# Patient Record
Sex: Female | Born: 1960 | Hispanic: No | Marital: Single | Smoking: Current every day smoker
Health system: Southern US, Community
[De-identification: ages and names within clinical notes are randomized; demographics above are authoritative.]

## PROBLEM LIST (undated history)

## (undated) DIAGNOSIS — J302 Other seasonal allergic rhinitis: Secondary | ICD-10-CM

## (undated) DIAGNOSIS — N200 Calculus of kidney: Secondary | ICD-10-CM

## (undated) DIAGNOSIS — I2510 Atherosclerotic heart disease of native coronary artery without angina pectoris: Secondary | ICD-10-CM

## (undated) DIAGNOSIS — F39 Unspecified mood [affective] disorder: Secondary | ICD-10-CM

## (undated) DIAGNOSIS — F319 Bipolar disorder, unspecified: Secondary | ICD-10-CM

## (undated) HISTORY — DX: Bipolar disorder, unspecified: F31.9

## (undated) HISTORY — DX: Atherosclerotic heart disease of native coronary artery without angina pectoris: I25.10

## (undated) HISTORY — DX: Calculus of kidney: N20.0

---

## 1999-01-24 ENCOUNTER — Other Ambulatory Visit: Admission: RE | Admit: 1999-01-24 | Discharge: 1999-01-24 | Payer: Self-pay | Admitting: Family Medicine

## 2002-01-31 ENCOUNTER — Other Ambulatory Visit: Admission: RE | Admit: 2002-01-31 | Discharge: 2002-01-31 | Payer: Self-pay | Admitting: Obstetrics and Gynecology

## 2012-04-21 ENCOUNTER — Other Ambulatory Visit: Payer: Self-pay | Admitting: Internal Medicine

## 2012-04-21 DIAGNOSIS — Z1231 Encounter for screening mammogram for malignant neoplasm of breast: Secondary | ICD-10-CM

## 2012-05-25 ENCOUNTER — Ambulatory Visit: Payer: Self-pay

## 2012-06-07 ENCOUNTER — Ambulatory Visit: Payer: Self-pay

## 2014-10-18 ENCOUNTER — Emergency Department (INDEPENDENT_AMBULATORY_CARE_PROVIDER_SITE_OTHER)
Admission: EM | Admit: 2014-10-18 | Discharge: 2014-10-18 | Disposition: A | Discharge status: Home / Self Care | Payer: Managed Care, Other (non HMO) | Source: Home / Self Care | Attending: Family Medicine | Admitting: Family Medicine

## 2014-10-18 ENCOUNTER — Telehealth (HOSPITAL_COMMUNITY): Payer: Self-pay | Admitting: Family Medicine

## 2014-10-18 ENCOUNTER — Encounter (HOSPITAL_COMMUNITY): Payer: Self-pay | Admitting: Emergency Medicine

## 2014-10-18 DIAGNOSIS — F99 Mental disorder, not otherwise specified: Secondary | ICD-10-CM

## 2014-10-18 MED ORDER — DIVALPROEX SODIUM 500 MG PO DR TAB: 1000.0000 mg | DELAYED_RELEASE_TABLET | Freq: Every day | ORAL | Status: DC

## 2014-10-18 MED ORDER — OLANZAPINE 10 MG PO TABS: 10.0000 mg | ORAL_TABLET | Freq: Every day | ORAL | Status: DC

## 2014-10-18 MED ORDER — FLUPHENAZINE HCL 5 MG PO TABS: 5.0000 mg | ORAL_TABLET | Freq: Every day | ORAL | Status: DC

## 2014-10-18 NOTE — ED Provider Notes (Signed)
Kristina Knox is a 53 y.o. female who presents to Urgent Care today for refill. Patient requires refill of both her olanzapine and Prolixin. She has moved to [place] is having difficulty commuting to [place] in [place] for her mental health needs. She has run out of Zyprexa and is about to run out of Prolixin. She tolerates these medications well without any significant bothersome side effects. She feels well currently with no SI or HI.   History reviewed. No pertinent past medical history. History reviewed. No pertinent past surgical history. History  Substance Use Topics  . Smoking status: Former Games developermoker  . Smokeless tobacco: Not on file  . Alcohol Use: Not on file   ROS as above Medications: No current facility-administered medications for this encounter.   Current Outpatient Prescriptions  Medication Sig Dispense Refill  . divalproex (DEPAKOTE) 500 MG DR tablet Take 1,000 mg by mouth at bedtime.    . fluPHENAZine (PROLIXIN) 5 MG tablet Take 1 tablet (5 mg total) by mouth at bedtime. 30 tablet 1  . OLANZapine (ZYPREXA) 10 MG tablet Take 1 tablet (10 mg total) by mouth at bedtime. 30 tablet 1   Not on File   Exam:  BP 156/87 mmHg  Pulse 109  Temp(Src) 98.3 F (36.8 C) (Oral)  Resp 16  SpO2 97% Gen: Well NAD Psych: Alert and oriented normal thought process, speech patterns. No SI or HI. No delusions or hallucinations expressed.   No results found for this or any previous visit (from the past 24 hour(s)). No results found.  Assessment and Plan: 53 y.o. female with refill Prolixin and Zyprexa. Refer to [facility].  Discussed warning signs or symptoms. Please see discharge instructions. Patient expresses understanding.     Rodolph BongEvan S Klaira Pesci, MD 10/18/14 [phone]

## 2014-10-18 NOTE — Discharge Instructions (Signed)
Thank you for coming in today. Take medications as directed Follow-up with a mental health doctor in [place] [facility] Go to the emergency room if he feels like hurting herself or others

## 2014-10-18 NOTE — ED Notes (Signed)
Pt states that she needs a medication refill on her medications.

## 2014-11-30 NOTE — ED Notes (Signed)
Note opened in error  Kristina Halvorsen S Orlie Cundari, MD 11/30/14 0859 

## 2015-01-04 ENCOUNTER — Emergency Department (INDEPENDENT_AMBULATORY_CARE_PROVIDER_SITE_OTHER)
Admission: EM | Admit: 2015-01-04 | Discharge: 2015-01-04 | Disposition: A | Discharge status: Home / Self Care | Payer: Medicare HMO | Source: Home / Self Care | Attending: Emergency Medicine | Admitting: Emergency Medicine

## 2015-01-04 ENCOUNTER — Encounter (HOSPITAL_COMMUNITY): Payer: Self-pay | Admitting: *Deleted

## 2015-01-04 ENCOUNTER — Telehealth: Payer: Self-pay

## 2015-01-04 DIAGNOSIS — F39 Unspecified mood [affective] disorder: Secondary | ICD-10-CM

## 2015-01-04 DIAGNOSIS — Z760 Encounter for issue of repeat prescription: Secondary | ICD-10-CM

## 2015-01-04 MED ORDER — OLANZAPINE 10 MG PO TABS
10.0000 mg | ORAL_TABLET | Freq: Every day | ORAL | Status: DC
Start: 2015-01-04 — End: 2015-05-08

## 2015-01-04 MED ORDER — DIVALPROEX SODIUM 500 MG PO DR TAB: 1000.0000 mg | DELAYED_RELEASE_TABLET | Freq: Every day | ORAL | Status: DC

## 2015-01-04 MED ORDER — FLUPHENAZINE HCL 5 MG PO TABS
5.0000 mg | ORAL_TABLET | Freq: Every day | ORAL | Status: DC
Start: 2015-01-04 — End: 2015-05-08

## 2015-01-04 NOTE — Discharge Instructions (Signed)
I have refilled your medications. They should be ready at your pharmacy Monday morning. Make sure you establish with your new doctor for additional refills.

## 2015-01-04 NOTE — Telephone Encounter (Signed)
A user error has taken place: encounter opened in error, closed for administrative reasons.

## 2015-01-04 NOTE — ED Provider Notes (Signed)
CSN: [id number]     Arrival date & time 01/04/15  1748 History   First MD Initiated Contact with Patient 01/04/15 1819     Chief Complaint  Patient presents with  . Medication Refill   (Consider location/radiation/quality/duration/timing/severity/associated sxs/prior Treatment) HPI  She is a 54 year old woman here for medication refill. She states she has been on Depakote, olanzapine, fluphenazine for over 10 years for mood stabilization. Her insurance has changed recently, and she is in the process of establishing care in the [facility] system.  She denies any trouble with these medications. She has not yet run out, but is running low.  History reviewed. No pertinent past medical history. History reviewed. No pertinent past surgical history. History reviewed. No pertinent family history. History  Substance Use Topics  . Smoking status: Former Games developermoker  . Smokeless tobacco: Not on file  . Alcohol Use: Not on file   OB History    No data available     Review of Systems See history of present illness Allergies  Review of patient's allergies indicates no known allergies.  Home Medications   Prior to Admission medications   Medication Sig Start Date End Date Taking? Authorizing Provider  divalproex (DEPAKOTE) 500 MG DR tablet Take 2 tablets (1,000 mg total) by mouth at bedtime. 01/04/15   Charm RingsErin J Sherian Valenza, MD  fluPHENAZine (PROLIXIN) 5 MG tablet Take 1 tablet (5 mg total) by mouth at bedtime. 01/04/15   Charm RingsErin J Adreyan Carbajal, MD  OLANZapine (ZYPREXA) 10 MG tablet Take 1 tablet (10 mg total) by mouth at bedtime. 01/04/15   Charm RingsErin J Sanita Estrada, MD   BP 140/80 mmHg  Pulse 72  Temp(Src) 98.3 F (36.8 C) (Oral)  SpO2 99%  LMP 12/04/2014 Physical Exam  Constitutional: She is oriented to person, place, and time. She appears well-developed and well-nourished. No distress.  Cardiovascular: Normal rate.   Pulmonary/Chest: Effort normal.  Neurological: She is alert and oriented to person, place, and time.    ED  Course  Procedures (including critical care time) Labs Review Labs Reviewed - No data to display  Imaging Review No results found.   MDM   1. Medication refill    3 months of Depakote sent to pharmacy. One month of fluphenazine and one month of olanzapine sent to pharmacy. Follow-up as needed.    Charm RingsErin J Drezden Seitzinger, MD 01/04/15 [phone]

## 2015-01-04 NOTE — ED Notes (Signed)
Pt  Requesting  A  Refill     Of  Her depakote        She      denys  Any  Symptoms

## 2015-05-08 ENCOUNTER — Emergency Department (INDEPENDENT_AMBULATORY_CARE_PROVIDER_SITE_OTHER)
Admission: EM | Admit: 2015-05-08 | Discharge: 2015-05-08 | Disposition: A | Discharge status: Home / Self Care | Payer: Medicare HMO | Source: Home / Self Care | Attending: Family Medicine | Admitting: Family Medicine

## 2015-05-08 ENCOUNTER — Encounter (HOSPITAL_COMMUNITY): Payer: Self-pay | Admitting: *Deleted

## 2015-05-08 DIAGNOSIS — F39 Unspecified mood [affective] disorder: Secondary | ICD-10-CM

## 2015-05-08 DIAGNOSIS — Z760 Encounter for issue of repeat prescription: Secondary | ICD-10-CM

## 2015-05-08 HISTORY — DX: Unspecified mood (affective) disorder: F39

## 2015-05-08 HISTORY — DX: Other seasonal allergic rhinitis: J30.2

## 2015-05-08 MED ORDER — OLANZAPINE 10 MG PO TABS: 10.0000 mg | ORAL_TABLET | Freq: Every day | ORAL | Status: DC

## 2015-05-08 MED ORDER — FLUPHENAZINE HCL 5 MG PO TABS: 5.0000 mg | ORAL_TABLET | Freq: Every day | ORAL | Status: DC

## 2015-05-08 MED ORDER — DIVALPROEX SODIUM 500 MG PO DR TAB
1000.0000 mg | DELAYED_RELEASE_TABLET | Freq: Every day | ORAL | Status: DC
Start: 2015-05-08 — End: 2018-12-13

## 2015-05-08 NOTE — ED Notes (Signed)
Pt is here requesting 3 medication refills.

## 2015-05-08 NOTE — Discharge Instructions (Signed)
We have refilled her medicines for 2 weeks. Please call doctor Avbuere to refill these medicines beyond this point.

## 2015-05-08 NOTE — ED Provider Notes (Signed)
CSN: [id number]     Arrival date & time 05/08/15  1643 History   First MD Initiated Contact with Patient 05/08/15 1728     Chief Complaint  Patient presents with  . Medication Refill   (Consider location/radiation/quality/duration/timing/severity/associated sxs/prior Treatment) HPI Patient presenting today for medication refill. States that she is taking Depakote, olanzapine, and fluphenazine. Patient states that the last medication refill she had was in February by Dr. all negative. Patient states that she still has 2-3 days worth of medications left over. Denies any change in her mental status such as flight of ideas, cognitive delay,. Patient states that her primary care physician is Dr Concepcion Elk of [facility].  Of note patient has a difficult time explaining exactly when she was given her medications but states she's been taking her medications as prescribed. In checking her note patient was only given a 30 day supply of fluphenazine and olanzapine back in February. When asked about how pick the patient could've possibly taken her medications during the months of March April and May patient states that she got her refills from her pharmacy was unable to delineate whether physician may have prescribed her medications. Patient denies visual or auditory hallucinations.      Past Medical History  Diagnosis Date  . Mood disorder   . Seasonal allergies    History reviewed. No pertinent past surgical history. History reviewed. No pertinent family history. History  Substance Use Topics  . Smoking status: Former Games developer  . Smokeless tobacco: Not on file  . Alcohol Use: No   OB History    No data available     Review of Systems Per HPI with all other pertinent systems negative.   Allergies  Codeine  Home Medications   Prior to Admission medications   Medication Sig Start Date End Date Taking? Authorizing Provider  divalproex (DEPAKOTE) 500 MG DR tablet Take 2 tablets (1,000 mg  total) by mouth at bedtime. 05/08/15   Ozella Rocks, MD  fluPHENAZine (PROLIXIN) 5 MG tablet Take 1 tablet (5 mg total) by mouth at bedtime. 05/08/15   Ozella Rocks, MD  OLANZapine (ZYPREXA) 10 MG tablet Take 1 tablet (10 mg total) by mouth at bedtime. 05/08/15   Ozella Rocks, MD   BP 142/89 mmHg  Pulse 96  Temp(Src) 99.1 F (37.3 C) (Oral)  Resp 16  SpO2 100% Physical Exam Physical Exam  Constitutional: oriented to person, place, and time. appears well-developed and well-nourished. No distress.  HENT:  Head: Normocephalic and atraumatic.  Eyes: EOMI. PERRL.  Neck: Normal range of motion.  Pulmonary/Chest: Effort normal. No respiratory distress.  Abdominal: Soft. Bowel sounds are normal. NonTTP, no distension.  Musculoskeletal: Normal range of motion. Non ttp, no effusion.  Neurological: alert and oriented to person, place, and time.  Skin: Skin is warm. No rash noted. non diaphoretic.   ED Course  Procedures (including critical care time) Labs Review Labs Reviewed - No data to display  Imaging Review No results found.   MDM   1. Medication refill   2. Mood disorder    Patient is a very poor historian with regards to her medications and doctor's appointments. I personally attempted to reach Dr.Avbuere through the answering service but never received a return phone call. Patient's medications will be refilled for 2 weeks only and patient given very clear instructions to follow-up with her primary care physician for any further refills. Patient to the ED if she develops any destabilization of her mood.  Ozella Rocks, MD 05/08/15 [phone]

## 2015-05-16 ENCOUNTER — Other Ambulatory Visit: Payer: Self-pay | Admitting: Internal Medicine

## 2015-05-16 DIAGNOSIS — E2839 Other primary ovarian failure: Secondary | ICD-10-CM

## 2015-06-18 ENCOUNTER — Inpatient Hospital Stay: Admission: RE | Admit: 2015-06-18 | Payer: Medicare HMO | Source: Ambulatory Visit

## 2015-07-09 ENCOUNTER — Inpatient Hospital Stay: Admission: RE | Admit: 2015-07-09 | Payer: Medicare HMO | Source: Ambulatory Visit

## 2015-08-06 ENCOUNTER — Other Ambulatory Visit: Payer: Medicare HMO

## 2015-08-12 ENCOUNTER — Ambulatory Visit
Admission: RE | Admit: 2015-08-12 | Discharge: 2015-08-12 | Disposition: A | Discharge status: Home / Self Care | Payer: Medicare HMO | Source: Ambulatory Visit | Attending: Internal Medicine | Admitting: Internal Medicine

## 2015-08-12 DIAGNOSIS — E2839 Other primary ovarian failure: Secondary | ICD-10-CM

## 2017-09-02 ENCOUNTER — Other Ambulatory Visit: Payer: Self-pay | Admitting: Family Medicine

## 2017-09-02 DIAGNOSIS — R945 Abnormal results of liver function studies: Secondary | ICD-10-CM

## 2017-09-02 DIAGNOSIS — R7989 Other specified abnormal findings of blood chemistry: Secondary | ICD-10-CM

## 2017-09-06 ENCOUNTER — Ambulatory Visit
Admission: RE | Admit: 2017-09-06 | Discharge: 2017-09-06 | Disposition: A | Discharge status: Home / Self Care | Payer: Medicare Other | Source: Ambulatory Visit | Attending: Family Medicine | Admitting: Family Medicine

## 2017-09-06 DIAGNOSIS — R7989 Other specified abnormal findings of blood chemistry: Secondary | ICD-10-CM

## 2017-09-06 DIAGNOSIS — R945 Abnormal results of liver function studies: Secondary | ICD-10-CM

## 2018-12-04 ENCOUNTER — Other Ambulatory Visit: Payer: Self-pay

## 2018-12-04 ENCOUNTER — Emergency Department (HOSPITAL_COMMUNITY): Payer: Medicare Other

## 2018-12-04 ENCOUNTER — Encounter (HOSPITAL_COMMUNITY): Payer: Self-pay | Admitting: Obstetrics and Gynecology

## 2018-12-04 ENCOUNTER — Inpatient Hospital Stay (HOSPITAL_COMMUNITY)
Admission: EM | Admit: 2018-12-04 | Discharge: 2018-12-13 | DRG: 444 | Disposition: A | Discharge status: Home / Self Care | Payer: Medicare Other | Attending: Family Medicine | Admitting: Family Medicine

## 2018-12-04 DIAGNOSIS — G9341 Metabolic encephalopathy: Secondary | ICD-10-CM

## 2018-12-04 DIAGNOSIS — K8067 Calculus of gallbladder and bile duct with acute and chronic cholecystitis with obstruction: Principal | ICD-10-CM | POA: Diagnosis present

## 2018-12-04 DIAGNOSIS — K8050 Calculus of bile duct without cholangitis or cholecystitis without obstruction: Secondary | ICD-10-CM

## 2018-12-04 DIAGNOSIS — Z885 Allergy status to narcotic agent status: Secondary | ICD-10-CM

## 2018-12-04 DIAGNOSIS — E875 Hyperkalemia: Secondary | ICD-10-CM | POA: Diagnosis not present

## 2018-12-04 DIAGNOSIS — Z79899 Other long term (current) drug therapy: Secondary | ICD-10-CM

## 2018-12-04 DIAGNOSIS — F39 Unspecified mood [affective] disorder: Secondary | ICD-10-CM

## 2018-12-04 DIAGNOSIS — F17200 Nicotine dependence, unspecified, uncomplicated: Secondary | ICD-10-CM | POA: Diagnosis present

## 2018-12-04 DIAGNOSIS — F309 Manic episode, unspecified: Secondary | ICD-10-CM

## 2018-12-04 DIAGNOSIS — R7989 Other specified abnormal findings of blood chemistry: Secondary | ICD-10-CM

## 2018-12-04 DIAGNOSIS — Z590 Homelessness: Secondary | ICD-10-CM | POA: Diagnosis not present

## 2018-12-04 DIAGNOSIS — R935 Abnormal findings on diagnostic imaging of other abdominal regions, including retroperitoneum: Secondary | ICD-10-CM | POA: Diagnosis present

## 2018-12-04 DIAGNOSIS — R945 Abnormal results of liver function studies: Secondary | ICD-10-CM

## 2018-12-04 DIAGNOSIS — Z9114 Patient's other noncompliance with medication regimen: Secondary | ICD-10-CM | POA: Diagnosis not present

## 2018-12-04 DIAGNOSIS — Z23 Encounter for immunization: Secondary | ICD-10-CM

## 2018-12-04 DIAGNOSIS — F319 Bipolar disorder, unspecified: Secondary | ICD-10-CM

## 2018-12-04 DIAGNOSIS — K8011 Calculus of gallbladder with chronic cholecystitis with obstruction: Secondary | ICD-10-CM

## 2018-12-04 DIAGNOSIS — D649 Anemia, unspecified: Secondary | ICD-10-CM

## 2018-12-04 DIAGNOSIS — K8042 Calculus of bile duct with acute cholecystitis without obstruction: Secondary | ICD-10-CM

## 2018-12-04 DIAGNOSIS — K8051 Calculus of bile duct without cholangitis or cholecystitis with obstruction: Secondary | ICD-10-CM | POA: Diagnosis not present

## 2018-12-04 DIAGNOSIS — E876 Hypokalemia: Secondary | ICD-10-CM | POA: Diagnosis present

## 2018-12-04 LAB — COMPREHENSIVE METABOLIC PANEL
ALBUMIN: 2.9 g/dL — AB (ref 3.5–5.0)
ALT: 154 U/L — ABNORMAL HIGH (ref 0–44)
AST: 196 U/L — ABNORMAL HIGH (ref 15–41)
Alkaline Phosphatase: 1222 U/L — ABNORMAL HIGH (ref 38–126)
Anion gap: 8 (ref 5–15)
BUN: 11 mg/dL (ref 6–20)
CHLORIDE: 104 mmol/L (ref 98–111)
CO2: 27 mmol/L (ref 22–32)
Calcium: 8.4 mg/dL — ABNORMAL LOW (ref 8.9–10.3)
Creatinine, Ser: 0.56 mg/dL (ref 0.44–1.00)
GFR calc Af Amer: >60 mL/min (ref >60)
GFR calc non Af Amer: >60 mL/min (ref >60)
GLUCOSE: 76 mg/dL (ref 70–99)
Potassium: 2.7 mmol/L — CL (ref 3.5–5.1)
SODIUM: 139 mmol/L (ref 135–145)
Total Bilirubin: 6.8 mg/dL — ABNORMAL HIGH (ref 0.3–1.2)
Total Protein: 6.7 g/dL (ref 6.5–8.1)

## 2018-12-04 LAB — URINALYSIS, ROUTINE W REFLEX MICROSCOPIC
Bilirubin Urine: NEGATIVE
Glucose, UA: NEGATIVE mg/dL
Hgb urine dipstick: NEGATIVE
Ketones, ur: NEGATIVE mg/dL
Leukocytes, UA: NEGATIVE
NITRITE: NEGATIVE
PH: 6 (ref 5.0–8.0)
PROTEIN: NEGATIVE mg/dL
Specific Gravity, Urine: 1.01 (ref 1.005–1.030)

## 2018-12-04 LAB — VITAMIN B12: Vitamin B-12: 1440 pg/mL — ABNORMAL HIGH (ref 180–914)

## 2018-12-04 LAB — CBC WITH DIFFERENTIAL/PLATELET
Abs Immature Granulocytes: 0.06 10*3/uL (ref 0.00–0.07)
Basophils Absolute: 0 10*3/uL (ref 0.0–0.1)
Basophils Relative: 0 %
Eosinophils Absolute: 0 10*3/uL (ref 0.0–0.5)
Eosinophils Relative: 0 %
HEMATOCRIT: 29.2 % — AB (ref 36.0–46.0)
Hemoglobin: 9.9 g/dL — ABNORMAL LOW (ref 12.0–15.0)
Immature Granulocytes: 1 %
LYMPHS ABS: 3.2 10*3/uL (ref 0.7–4.0)
Lymphocytes Relative: 31 %
MCH: 33.1 pg (ref 26.0–34.0)
MCHC: 33.9 g/dL (ref 30.0–36.0)
MCV: 97.7 fL (ref 80.0–100.0)
MONO ABS: 0.8 10*3/uL (ref 0.1–1.0)
MONOS PCT: 8 %
Neutro Abs: 6.3 10*3/uL (ref 1.7–7.7)
Neutrophils Relative %: 60 %
PLATELETS: 572 10*3/uL — AB (ref 150–400)
RBC: 2.99 MIL/uL — ABNORMAL LOW (ref 3.87–5.11)
RDW: 17.1 % — ABNORMAL HIGH (ref 11.5–15.5)
WBC: 10.4 10*3/uL (ref 4.0–10.5)
nRBC: 0 % (ref 0.0–0.2)

## 2018-12-04 LAB — FERRITIN: Ferritin: 151 ng/mL (ref 11–307)

## 2018-12-04 LAB — IRON AND TIBC
Iron: 49 ug/dL (ref 28–170)
Saturation Ratios: 19 % (ref 10.4–31.8)
TIBC: 256 ug/dL (ref 250–450)
UIBC: 207 ug/dL

## 2018-12-04 LAB — FOLATE: Folate: 10.6 ng/mL (ref >5.9)

## 2018-12-04 LAB — HEPATIC FUNCTION PANEL
ALK PHOS: 1223 U/L — AB (ref 38–126)
ALT: 150 U/L — ABNORMAL HIGH (ref 0–44)
AST: 190 U/L — ABNORMAL HIGH (ref 15–41)
Albumin: 2.8 g/dL — ABNORMAL LOW (ref 3.5–5.0)
Bilirubin, Direct: 3.8 mg/dL — ABNORMAL HIGH (ref 0.0–0.2)
Indirect Bilirubin: 2.7 mg/dL — ABNORMAL HIGH (ref 0.3–0.9)
TOTAL PROTEIN: 6.7 g/dL (ref 6.5–8.1)
Total Bilirubin: 6.5 mg/dL — ABNORMAL HIGH (ref 0.3–1.2)

## 2018-12-04 LAB — RAPID URINE DRUG SCREEN, HOSP PERFORMED
AMPHETAMINES: NOT DETECTED
BARBITURATES: NOT DETECTED
Benzodiazepines: NOT DETECTED
Cocaine: NOT DETECTED
Opiates: NOT DETECTED
Tetrahydrocannabinol: NOT DETECTED

## 2018-12-04 LAB — SALICYLATE LEVEL: Salicylate Lvl: <7 mg/dL (ref 2.8–30.0)

## 2018-12-04 LAB — PROTIME-INR
INR: 0.91
Prothrombin Time: 12.2 seconds (ref 11.4–15.2)

## 2018-12-04 LAB — AMMONIA: Ammonia: 50 umol/L — ABNORMAL HIGH (ref 9–35)

## 2018-12-04 LAB — LIPASE, BLOOD: Lipase: 86 U/L — ABNORMAL HIGH (ref 11–51)

## 2018-12-04 LAB — LACTATE DEHYDROGENASE: LDH: 199 U/L — ABNORMAL HIGH (ref 98–192)

## 2018-12-04 LAB — ACETAMINOPHEN LEVEL: Acetaminophen (Tylenol), Serum: <10 ug/mL — ABNORMAL LOW (ref 10–30)

## 2018-12-04 LAB — I-STAT BETA HCG BLOOD, ED (MC, WL, AP ONLY): I-stat hCG, quantitative: <5 m[IU]/mL (ref <5)

## 2018-12-04 LAB — ETHANOL: Alcohol, Ethyl (B): <10 mg/dL (ref <10)

## 2018-12-04 LAB — MAGNESIUM: Magnesium: 1.7 mg/dL (ref 1.7–2.4)

## 2018-12-04 MED ORDER — POTASSIUM CHLORIDE 10 MEQ/100ML IV SOLN
10.0000 meq | INTRAVENOUS | Status: AC
  Administered 2018-12-04 (×2): 10 meq via INTRAVENOUS
  Filled 2018-12-04 (×3): qty 100

## 2018-12-04 MED ORDER — ONDANSETRON HCL 4 MG/2ML IJ SOLN: 4.0000 mg | Freq: Four times a day (QID) | INTRAMUSCULAR | Status: DC | PRN

## 2018-12-04 MED ORDER — ACETAMINOPHEN 325 MG PO TABS: 650.0000 mg | ORAL_TABLET | Freq: Four times a day (QID) | ORAL | Status: DC | PRN

## 2018-12-04 MED ORDER — ACETAMINOPHEN 650 MG RE SUPP: 650.0000 mg | Freq: Four times a day (QID) | RECTAL | Status: DC | PRN

## 2018-12-04 MED ORDER — PIPERACILLIN-TAZOBACTAM 3.375 G IVPB 30 MIN: 3.3750 g | Freq: Three times a day (TID) | INTRAVENOUS | Status: DC

## 2018-12-04 MED ORDER — POTASSIUM CHLORIDE IN NACL 20-0.9 MEQ/L-% IV SOLN
INTRAVENOUS | Status: DC
  Administered 2018-12-04 – 2018-12-08 (×5): via INTRAVENOUS
  Filled 2018-12-04 (×5): qty 1000

## 2018-12-04 MED ORDER — PIPERACILLIN-TAZOBACTAM 3.375 G IVPB
3.3750 g | Freq: Three times a day (TID) | INTRAVENOUS | Status: DC
  Administered 2018-12-05 – 2018-12-09 (×14): 3.375 g via INTRAVENOUS
  Filled 2018-12-04 (×14): qty 50

## 2018-12-04 MED ORDER — POTASSIUM CHLORIDE CRYS ER 20 MEQ PO TBCR
40.0000 meq | EXTENDED_RELEASE_TABLET | Freq: Once | ORAL | Status: AC
  Administered 2018-12-04: 40 meq via ORAL
  Filled 2018-12-04: qty 2

## 2018-12-04 MED ORDER — POLYETHYLENE GLYCOL 3350 17 G PO PACK: 17.0000 g | PACK | Freq: Every day | ORAL | Status: DC | PRN

## 2018-12-04 MED ORDER — PIPERACILLIN-TAZOBACTAM 3.375 G IVPB 30 MIN
3.3750 g | Freq: Once | INTRAVENOUS | Status: AC
  Administered 2018-12-04: 3.375 g via INTRAVENOUS
  Filled 2018-12-04: qty 50

## 2018-12-04 MED ORDER — ONDANSETRON HCL 4 MG PO TABS: 4.0000 mg | ORAL_TABLET | Freq: Four times a day (QID) | ORAL | Status: DC | PRN

## 2018-12-04 MED ORDER — SODIUM CHLORIDE 0.9 % IV BOLUS
1000.0000 mL | Freq: Once | INTRAVENOUS | Status: AC
  Administered 2018-12-04: 1000 mL via INTRAVENOUS

## 2018-12-04 MED ORDER — MAGNESIUM SULFATE IN D5W 1-5 GM/100ML-% IV SOLN
1.0000 g | Freq: Once | INTRAVENOUS | Status: AC
  Administered 2018-12-04: 1 g via INTRAVENOUS
  Filled 2018-12-04: qty 100

## 2018-12-04 MED ORDER — ENOXAPARIN SODIUM 40 MG/0.4ML ~~LOC~~ SOLN
40.0000 mg | SUBCUTANEOUS | Status: DC
  Filled 2018-12-04: qty 0.4

## 2018-12-04 NOTE — ED Provider Notes (Signed)
4:28 PM Assumed care from Dr. Tyrone Nine.  At time of transfer care, patient has been seen for likely mania and psychiatric disturbance.  Psychiatry saw the patient and felt she was approved for inpatient management.  Patient was awaiting laboratory testing before she can be placed from a psychiatric standpoint.  After assumption of care, patient's labs began to return and there were significant abnormalities.  Lab review shows that patient has elevated AST and ALT as well as alk phos over 1200.  Bilirubin is 6.8 and patient has low potassium.  Patient will need further work-up for medical clearance to figure out the etiology of these abnormalities.  4:46 PM On my initial assessment, patient has no abdominal tenderness or flank tenderness.  Lungs clear.  Patient does not appear to have a tremor.  Patient denies any history of gallbladder problems, liver problems, pancreas problems, or hepatitis.   Given the lab abnormalities discovered, patient will given IV and oral potassium, she be given some fluids.  And will have other work-up including ammonia to look for hepatic encephalopathy as the cause of her altered mental status, check a lipase, and check a hepatitis panel.  Given the patient's lab abnormalities with hypokalemia, anticipate she will require admission for further hepatic monitoring before she is medically cleared for psychiatric management.  7:12 PM Radiologist called to report that there are significant abnormalities on the right upper quadrant ultrasound.  Patient has a 64m common bile duct with stones in it.  He reports there is pericholecystic fluid and gallbladder wall thickening.  Although patient did not have tenderness on the ultrasound exam, he is concerned about acute cholecystitis versus choledocholithiasis.  With the common bile duct stones, gastroenterology will be called and anticipate patient will require admission for further management.  7:35 PM Spoke with Dr.  [facility]with the [facility] who recommends admission to medicine, n.p.o., antibiotics for possible infection, and they will see her tomorrow for likely ERCP.  They did not feel surgery consult was needed emergently but they will likely speak with him tomorrow.  Hospitalist team will be called for admission.  CRITICAL CARE Performed by: CGwenyth Allegra Total critical care time: 35 minutes Critical care time was exclusive of separately billable procedures and treating other patients. Critical care was necessary to treat or prevent imminent or life-threatening deterioration. Critical care was time spent personally by me on the following activities: development of treatment plan with patient and/or surrogate as well as nursing, discussions with consultants, evaluation of patient's response to treatment, examination of patient, obtaining history from patient or surrogate, ordering and performing treatments and interventions, ordering and review of laboratory studies, ordering and review of radiographic studies, pulse oximetry and re-evaluation of patient's condition.    Clinical Impression: 1. [facility]   2. LFT elevation     Disposition: Admit  This note was prepared with assistance of Dragon voice recognition software. Occasional wrong-word or sound-a-like substitutions may have occurred due to the inherent limitations of voice recognition software.      , CGwenyth Allegra MD 12/05/18 [phone]

## 2018-12-04 NOTE — ED Notes (Signed)
Bed: RESA Expected date:  Expected time:  Means of arrival:  Comments: Room 26

## 2018-12-04 NOTE — ED Notes (Signed)
Pt transferred to Res-A. Report given to RN. Pt calm and cooperative.

## 2018-12-04 NOTE — BH Assessment (Signed)
[facility] Assessment Progress Note Case was staffed with Lord DNP who recommended a inpatient admission to assist with stabilization.       
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
## 2018-12-04 NOTE — ED Notes (Signed)
Called phlebotomy to draw blood. [facility] tried x2 and was unable to obtain blood.
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
## 2018-12-04 NOTE — ED Notes (Signed)
Date and time results received: 12/04/18 1624 (use smartphrase ".now" to insert current time)  Test: K+ Critical Value: 2.7  Name of Provider Notified: Dr Rush Landmarkegeler  Orders Received? Or Actions Taken?: Actions Taken: notified Dr Rush Landmarkegeler of K+ 2.7.
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
## 2018-12-04 NOTE — H&P (Signed)
H&P        History and Physical    Kristina Knox PZW:[id number] DOB: 1961-07-21 DOA: 12/04/2018  PCP: Darrow Bussing, MD  Patient coming from: Home  I have personally briefly reviewed patient's old medical records in [facility]  Chief Complaint: AMS  HPI: Kristina Knox is a 58 y.o. female with medical history significant of bipolar d/o presents with altered mental status.  Of note patient is alert and oriented x3 but cannot give me much history.  It appears patient son came in the area and found patient acting quite odd and talking about really weird things.  When they brought her into the ED she was actually seen by psych and they will admit her for psychiatric issues.  Labs were done and she was found to have a significantly elevated bilirubin and liver function test.  She subsequently got a ultrasound of her gallbladder which showed a very large common bile duct with stones and signs suggestive of cholecystitis.  Patient denied any abdominal pain and is not receiving pain medications here.  She does have history of bipolar disorder unsure if she has recently stopped her medications or not.    ED Course: ED physician Dr. Ricke Hey  spoke with GI Dr. . Osie Bond who recommended keep patient n.p.o. and will do ERCP in the morning.  She received dose of Iv Zosyn. Potassium was  2.7 and replaced both IV and p.o.  Review of Systems:  Denies cp, sob, fever, and pain + AMS  All others reviewed with patient  and is limited due to patient's altered mental status    Past Medical History:  Diagnosis Date  . Mood disorder (HCC)   . Seasonal allergies     History reviewed. No pertinent surgical history.   reports that she has been smoking. She has never used smokeless tobacco. She reports that she does not drink alcohol or use drugs.  Allergies  Allergen Reactions  . Codeine Nausea And Vomiting    History reviewed. No pertinent family history. Pt says she "doesn't keep up with her  family "  Prior to Admission medications   Medication Sig Start Date End Date Taking? Authorizing Provider  Ascorbic Acid (VITAMIN C PO) Take 1 tablet by mouth daily.   Yes [provider]  ibuprofen (ADVIL,MOTRIN) 200 MG tablet Take 400 mg by mouth daily as needed for moderate pain.   Yes [provider]  Melatonin 5 MG TABS Take 5-10 mg by mouth at bedtime.   Yes [provider]  Multiple Vitamins-Minerals (VISION FORMULA PO) Take 1 tablet by mouth daily.   Yes [provider]  naproxen sodium (ALEVE) 220 MG tablet Take 440 mg by mouth daily as needed (pain).   Yes [provider]  Omega-3 Fatty Acids (FISH OIL PO) Take 1 tablet by mouth daily.   Yes [provider]  POTASSIUM PO Take 1 tablet by mouth daily.   Yes [provider]  divalproex (DEPAKOTE) 500 MG DR tablet Take 2 tablets (1,000 mg total) by mouth at bedtime. Patient not taking: Reported on 12/04/2018 05/08/15   Ozella Rocks, MD  fluPHENAZine (PROLIXIN) 5 MG tablet Take 1 tablet (5 mg total) by mouth at bedtime. Patient not taking: Reported on 12/04/2018 05/08/15   Ozella Rocks, MD  OLANZapine (ZYPREXA) 10 MG tablet Take 1 tablet (10 mg total) by mouth at bedtime. Patient not taking: Reported on 12/04/2018 05/08/15   Ozella Rocks, MD  Physical Exam: Vitals:   12/04/18 1900 12/04/18 1930 12/04/18 2000 12/04/18 2030  BP: 132/82 131/81 140/79 131/66  Pulse: 84 70 74 76  Resp: 18 13 12 12   Temp:      TempSrc:      SpO2: 100% 100% 100% 100%    Constitutional: NAD,anxious Vitals:   12/04/18 1900 12/04/18 1930 12/04/18 2000 12/04/18 2030  BP: 132/82 131/81 140/79 131/66  Pulse: 84 70 74 76  Resp: 18 13 12 12   Temp:      TempSrc:      SpO2: 100% 100% 100% 100%   Eyes: PERRL, lids nml,  mild scleral icterus  ENMT: Mucous membranes are moist. Posterior pharynx clear of any exudate or lesions..  Neck: normal, supple,  Respiratory: clear to auscultation  bilaterally, no wheezing, no crackles. Normal respiratory effort. No accessory muscle use.  Cardiovascular: Regular rate and rhythm, no murmurs  No extremity edema..  Abdomen: no tenderness, no masses palpated.  Bowel sounds positive.  Musculoskeletal: no clubbing / cyanosis.  Skin: no rashes, lesions, ulcers. No induration Neurologic: CN 2-12 grossly intact. Moves all 4 ext equally  Psychiatric:  Alert and oriented x 3.  But spells may answers instead of saying them, + anxious doesn't know her age , odd demeanor   Labs on Admission: I have personally reviewed following labs and imaging studies  CBC: Recent Labs  Lab 12/04/18 1523  WBC 10.4  NEUTROABS 6.3  HGB 9.9*  HCT 29.2*  MCV 97.7  PLT 572*   Basic Metabolic Panel: Recent Labs  Lab 12/04/18 1523 12/04/18 2129  NA 139  --   K 2.7*  --   CL 104  --   CO2 27  --   GLUCOSE 76  --   BUN 11  --   CREATININE 0.56  --   CALCIUM 8.4*  --   MG  --  1.7   GFR: CrCl cannot be calculated (Unknown ideal weight.). Liver Function Tests: Recent Labs  Lab 12/04/18 1523 12/04/18 1702  AST 196* 190*  ALT 154* 150*  ALKPHOS 1,222* 1,223*  BILITOT 6.8* 6.5*  PROT 6.7 6.7  ALBUMIN 2.9* 2.8*   Recent Labs  Lab 12/04/18 1702  LIPASE 86*   Recent Labs  Lab 12/04/18 1702  AMMONIA 50*   Coagulation Profile: Recent Labs  Lab 12/04/18 1702  INR 0.91   Cardiac Enzymes: No results for input(s): CKTOTAL, CKMB, CKMBINDEX, TROPONINI in the last 168 hours. BNP (last 3 results) No results for input(s): PROBNP in the last 8760 hours. HbA1C: No results for input(s): HGBA1C in the last 72 hours. CBG: No results for input(s): GLUCAP in the last 168 hours. Lipid Profile: No results for input(s): CHOL, HDL, LDLCALC, TRIG, CHOLHDL, LDLDIRECT in the last 72 hours. Thyroid Function Tests: No results for input(s): TSH, T4TOTAL, FREET4, T3FREE, THYROIDAB in the last 72 hours. Anemia Panel: No results for input(s): VITAMINB12,  FOLATE, FERRITIN, TIBC, IRON, RETICCTPCT in the last 72 hours. Urine analysis:    Component Value Date/Time   COLORURINE AMBER (A) 12/04/2018 1202   APPEARANCEUR HAZY (A) 12/04/2018 1202   LABSPEC 1.010 12/04/2018 1202   PHURINE 6.0 12/04/2018 1202   GLUCOSEU NEGATIVE 12/04/2018 1202   HGBUR NEGATIVE 12/04/2018 1202   BILIRUBINUR NEGATIVE 12/04/2018 1202   KETONESUR NEGATIVE 12/04/2018 1202   PROTEINUR NEGATIVE 12/04/2018 1202   NITRITE NEGATIVE 12/04/2018 1202   LEUKOCYTESUR NEGATIVE 12/04/2018 1202    Radiological Exams on Admission: Dg Chest 2 View  Result Date: 12/04/2018 CLINICAL DATA:  Medical clearance EXAM: CHEST - 2 VIEW COMPARISON:  None. FINDINGS: Lungs are clear. The heart size and pulmonary vascularity are normal. No adenopathy. There is midthoracic levoscoliosis. IMPRESSION: No edema or consolidation. Electronically Signed   By: Bretta BangWilliam  Woodruff III M.D.   On: 12/04/2018 14:35   [place] Abdomen Limited Ruq  Result Date: 12/04/2018 CLINICAL DATA:  Elevated LFTs.  Confusion.  Altered mental status. EXAM: ULTRASOUND ABDOMEN LIMITED RIGHT UPPER QUADRANT COMPARISON:  None. FINDINGS: Gallbladder: There are multiple gallstones, with sludge. Gallbladder wall thickness is increased, 3.9 mm, with suspected pericholecystic fluid. No definite sonographic Murphy's sign. Common bile duct: Diameter: Common bile duct is abnormally enlarged up to 16 mm. There is a stone within the dilated common bile duct measuring 13 mm in largest dimension. Liver: Intrahepatic biliary ductal dilatation is present. Echogenicity is increased. Portal vein is patent on color Doppler imaging with normal direction of blood flow towards the liver. IMPRESSION: Multiple gallstones with sludge. Common bile duct gallstones with intrahepatic and extrahepatic biliary ductal dilatation, up to 16 mm CBD. Pericholecystic fluid, and gallbladder wall thickening without reported sonographic Murphy's sign. Concern raised for acute  cholecystitis. These results were called by telephone at the time of interpretation on 12/04/2018 at 7:04 pm to Dr. Lynden OxfordHRISTOPHER TEGELER , who verbally acknowledged these results. Electronically Signed   By: Elsie StainJohn T Curnes M.D.   On: 12/04/2018 19:08    EKG: Independently reviewed. NSR nml intervals   Assessment/Plan Principal Problem:   Choledocholithiasis with acute cholecystitis Encephalopathy Elevated liver function tests  hyperbilirubinemia Active Problems:   Bipolar disorder (HCC)   Hypokalemia Anemia normotcytic  1. Npo, Iv zosyn, Gi consulted ERCP in am  2. Encephalopathy suspect from above and ? Stopping Pysch  Meds,  ammonia barely up at 50. supporitve care. Psych assessed Pt in ED.  3. K+ replaced iv and po in ED> cont in IVF>check Mg, CMP in am  4.check iron studies, LDH and haptoglobin. no prior h/h to compare  DVT prophylaxis:SCD Code Status: full  Disposition Plan: home 2 days  Consults called: Dr Osie BondAmbuster GI  Admission status: IP tele    Ralf Konopka Johnson-Pitts MD [facility] Pager [phone]- [phone]  If 7PM-7AM, please contact night-coverage [email or web address] Password [email or web address]  12/04/2018, 10:10 PM

## 2018-12-04 NOTE — ED Notes (Signed)
Pt has been drinking Gatorade.

## 2018-12-04 NOTE — ED Notes (Signed)
Pt able to ambulate to bathroom. Refused shower. Ate entire lunch. Oriented to self, place, but seems unsure as to, and not really interested in situation. Stays in bed with no complaints voiced. When doing EKG pt told this writer, "I don't have a heart." This writer assured her that she does. She said, "You don't know."  This Clinical research associate said, "Which character in The Wizard of Oz was it that did not have a heart?" Pt said, "I don't know what you are talking about." She did not seem able to process the EKG experience, but allowed it.
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
## 2018-12-04 NOTE — ED Notes (Signed)
ED TO INPATIENT HANDOFF REPORT  Name/Age/Gender Kristina Knox 58 y.o. female  Code Status   Home/SNF/Other Home?  Chief Complaint Psych Eval  Level of Care/Admitting Diagnosis ED Disposition    ED Disposition Condition Comment   Admit  [facility]: [facility] [[id number]]  Level of Care: Telemetry [5]  Admit to tele based on following criteria: Other see comments  Comments: cholecystitis  Diagnosis: Choledocholithiasis [[id number]][[id number]]  Admitting Physician: Synetta FailJOHNSON-PITTS, ENDIA [[id number]]  Attending Physician: Synetta FailJOHNSON-PITTS, ENDIA [[id number]]  Estimated length of stay: past midnight tomorrow  Certification:: I certify this patient will need inpatient services for at least 2 midnights  PT Class (Do Not Modify): Inpatient [101]  PT Acc Code (Do Not Modify): Private [1]       Medical History Past Medical History:  Diagnosis Date  . Mood disorder (HCC)   . Seasonal allergies     Allergies Allergies  Allergen Reactions  . Codeine Nausea And Vomiting    IV Location/Drains/Wounds Patient Lines/Drains/Airways Status   Active Line/Drains/Airways    Name:   Placement date:   Placement time:   Site:   Days:   Peripheral IV 12/04/18 Right Forearm   12/04/18    1700    Forearm   less than 1          Labs/Imaging Results for orders placed or performed during the hospital encounter of 12/04/18 (from the past 48 hour(s))  Urine rapid drug screen (hosp performed)     Status: None   Collection Time: 12/04/18 12:02 PM  Result Value Ref Range   Opiates NONE DETECTED NONE DETECTED   Cocaine NONE DETECTED NONE DETECTED   Benzodiazepines NONE DETECTED NONE DETECTED   Amphetamines NONE DETECTED NONE DETECTED   Tetrahydrocannabinol NONE DETECTED NONE DETECTED   Barbiturates NONE DETECTED NONE DETECTED    Comment: (NOTE) DRUG SCREEN FOR MEDICAL PURPOSES ONLY.  IF CONFIRMATION IS NEEDED FOR ANY PURPOSE, NOTIFY LAB WITHIN 5 DAYS. LOWEST DETECTABLE LIMITS FOR  URINE DRUG SCREEN Drug Class                     Cutoff (ng/mL) Amphetamine and metabolites    1000 Barbiturate and metabolites    200 Benzodiazepine                 200 Tricyclics and metabolites     300 Opiates and metabolites        300 Cocaine and metabolites        300 THC                            50 Performed at [facility], [address] [place], [place] [address]   Urinalysis, Routine w reflex microscopic     Status: Abnormal   Collection Time: 12/04/18 12:02 PM  Result Value Ref Range   Color, Urine AMBER (A) YELLOW    Comment: BIOCHEMICALS MAY BE AFFECTED BY COLOR   APPearance HAZY (A) CLEAR   Specific Gravity, Urine 1.010 1.005 - 1.030   pH 6.0 5.0 - 8.0   Glucose, UA NEGATIVE NEGATIVE mg/dL   Hgb urine dipstick NEGATIVE NEGATIVE   Bilirubin Urine NEGATIVE NEGATIVE   Ketones, ur NEGATIVE NEGATIVE mg/dL   Protein, ur NEGATIVE NEGATIVE mg/dL   Nitrite NEGATIVE NEGATIVE   Leukocytes, UA NEGATIVE NEGATIVE    Comment: Performed at [facility], [address] [place], [place] [address]  Comprehensive  metabolic panel     Status: Abnormal   Collection Time: 12/04/18  3:23 PM  Result Value Ref Range   Sodium 139 135 - 145 mmol/L   Potassium 2.7 (LL) 3.5 - 5.1 mmol/L    Comment: CRITICAL RESULT CALLED TO, READ BACK BY AND VERIFIED WITH: Gena Fray [id number] @ 1619 BY J SCOTTON    Chloride 104 98 - 111 mmol/L   CO2 27 22 - 32 mmol/L   Glucose, Bld 76 70 - 99 mg/dL   BUN 11 6 - 20 mg/dL   Creatinine, Ser 5.37 0.44 - 1.00 mg/dL   Calcium 8.4 (L) 8.9 - 10.3 mg/dL   Total Protein 6.7 6.5 - 8.1 g/dL   Albumin 2.9 (L) 3.5 - 5.0 g/dL   AST 482 (H) 15 - 41 U/L   ALT 154 (H) 0 - 44 U/L   Alkaline Phosphatase 1,222 (H) 38 - 126 U/L   Total Bilirubin 6.8 (H) 0.3 - 1.2 mg/dL   GFR calc non Af Amer >60 >60 mL/min   GFR calc Af Amer >60 >60 mL/min   Anion gap 8 5 - 15    Comment: Performed at [facility], [address] [place], [place] [address]  Ethanol     Status: None   Collection Time: 12/04/18  3:23 PM  Result Value Ref Range   Alcohol, Ethyl (B) <10 <10 mg/dL    Comment: (NOTE) Lowest detectable limit for serum alcohol is 10 mg/dL. For medical purposes only. Performed at [facility], [address], [place], [place] [address]   CBC with Diff     Status: Abnormal   Collection Time: 12/04/18  3:23 PM  Result Value Ref Range   WBC 10.4 4.0 - 10.5 K/uL   RBC 2.99 (L) 3.87 - 5.11 MIL/uL   Hemoglobin 9.9 (L) 12.0 - 15.0 g/dL   HCT 20.1 (L) 00.7 - 12.1 %   MCV 97.7 80.0 - 100.0 fL   MCH 33.1 26.0 - 34.0 pg   MCHC 33.9 30.0 - 36.0 g/dL   RDW 97.5 (H) 88.3 - 25.4 %   Platelets 572 (H) 150 - 400 K/uL   nRBC 0.0 0.0 - 0.2 %   Neutrophils Relative % 60 %   Neutro Abs 6.3 1.7 - 7.7 K/uL   Lymphocytes Relative 31 %   Lymphs Abs 3.2 0.7 - 4.0 K/uL   Monocytes Relative 8 %   Monocytes Absolute 0.8 0.1 - 1.0 K/uL   Eosinophils Relative 0 %   Eosinophils Absolute 0.0 0.0 - 0.5 K/uL   Basophils Relative 0 %   Basophils Absolute 0.0 0.0 - 0.1 K/uL   Immature Granulocytes 1 %   Abs Immature Granulocytes 0.06 0.00 - 0.07 K/uL    Comment: Performed at [facility], [address] [place], [place] [address]  I-Stat beta hCG blood, ED     Status: None   Collection Time: 12/04/18  3:24 PM  Result Value Ref Range   I-stat hCG, quantitative <5.0 <5 mIU/mL   Comment 3            Comment:   GEST. AGE      CONC.  (mIU/mL)   <=1 WEEK        5 - 50     2 WEEKS       50 - 500     3 WEEKS       100 - 10,000  4 WEEKS     1,000 - 30,000        FEMALE AND NON-PREGNANT FEMALE:     LESS THAN 5 mIU/mL   Lipase, blood     Status: Abnormal   Collection Time: 12/04/18  5:02 PM  Result Value Ref Range   Lipase 86 (H) 11 - 51 U/L    Comment: Performed at [facility], [address] [place], [place] [address]  Hepatic function panel     Status:  Abnormal   Collection Time: 12/04/18  5:02 PM  Result Value Ref Range   Total Protein 6.7 6.5 - 8.1 g/dL   Albumin 2.8 (L) 3.5 - 5.0 g/dL   AST 604190 (H) 15 - 41 U/L   ALT 150 (H) 0 - 44 U/L   Alkaline Phosphatase 1,223 (H) 38 - 126 U/L   Total Bilirubin 6.5 (H) 0.3 - 1.2 mg/dL   Bilirubin, Direct 3.8 (H) 0.0 - 0.2 mg/dL   Indirect Bilirubin 2.7 (H) 0.3 - 0.9 mg/dL    Comment: Performed at [facility], [address] [place], [place] [address]  Acetaminophen level     Status: Abnormal   Collection Time: 12/04/18  5:02 PM  Result Value Ref Range   Acetaminophen (Tylenol), Serum <10 (L) 10 - 30 ug/mL    Comment: (NOTE) Therapeutic concentrations vary significantly. A range of 10-30 ug/mL  may be an effective concentration for many patients. However, some  are best treated at concentrations outside of this range. Acetaminophen concentrations >150 ug/mL at 4 hours after ingestion  and >50 ug/mL at 12 hours after ingestion are often associated with  toxic reactions. Performed at [facility], [address] [place], [place] [address]   Salicylate level     Status: None   Collection Time: 12/04/18  5:02 PM  Result Value Ref Range   Salicylate Lvl <7.0 2.8 - 30.0 mg/dL    Comment: Performed at [facility], [address] [place], [place] [address]  Ammonia     Status: Abnormal   Collection Time: 12/04/18  5:02 PM  Result Value Ref Range   Ammonia 50 (H) 9 - 35 umol/L    Comment: Performed at [facility], [address] [place], [place] [address]  Protime-INR     Status: None   Collection Time: 12/04/18  5:02 PM  Result Value Ref Range   Prothrombin Time 12.2 11.4 - 15.2 seconds   INR 0.91     Comment: Performed at [facility], [address] [place], [place] [address]   Dg Chest 2 View  Result Date: 12/04/2018 CLINICAL DATA:  Medical clearance EXAM: CHEST - 2 VIEW COMPARISON:   None. FINDINGS: Lungs are clear. The heart size and pulmonary vascularity are normal. No adenopathy. There is midthoracic levoscoliosis. IMPRESSION: No edema or consolidation. Electronically Signed   By: Bretta BangWilliam  Woodruff III M.D.   On: 12/04/2018 14:35   [place] Abdomen Limited Ruq  Result Date: 12/04/2018 CLINICAL DATA:  Elevated LFTs.  Confusion.  Altered mental status. EXAM: ULTRASOUND ABDOMEN LIMITED RIGHT UPPER QUADRANT COMPARISON:  None. FINDINGS: Gallbladder: There are multiple gallstones, with sludge. Gallbladder wall thickness is increased, 3.9 mm, with suspected pericholecystic fluid. No definite sonographic Murphy's sign. Common bile duct: Diameter: Common bile duct is abnormally enlarged up to 16 mm. There is a stone within the dilated common bile duct measuring 13 mm in largest dimension. Liver: Intrahepatic biliary ductal dilatation  is present. Echogenicity is increased. Portal vein is patent on color Doppler imaging with normal direction of blood flow towards the liver. IMPRESSION: Multiple gallstones with sludge. Common bile duct gallstones with intrahepatic and extrahepatic biliary ductal dilatation, up to 16 mm CBD. Pericholecystic fluid, and gallbladder wall thickening without reported sonographic Murphy's sign. Concern raised for acute cholecystitis. These results were called by telephone at the time of interpretation on 12/04/2018 at 7:04 pm to Dr. Lynden Oxford , who verbally acknowledged these results. Electronically Signed   By: Elsie Stain M.D.   On: 12/04/2018 19:08   None  Pending Labs Unresulted Labs (From admission, onward)    Start     Ordered   12/04/18 1937  Blood culture (routine x 2)  BLOOD CULTURE X 2,   STAT     12/04/18 1936          Vitals/Pain Today's Vitals   12/04/18 1830 12/04/18 1900 12/04/18 1930 12/04/18 2000  BP: 129/77 132/82 131/81 140/79  Pulse: 75 84 70 74  Resp: (!) 9 18 13 12   Temp:      TempSrc:      SpO2: 100% 100% 100% 100%     Isolation Precautions No active isolations  Medications Medications  potassium chloride SA (K-DUR,KLOR-CON) CR tablet 40 mEq (40 mEq Oral Given 12/04/18 1701)  potassium chloride 10 mEq in 100 mL IVPB (0 mEq Intravenous Stopped 12/04/18 1853)  sodium chloride 0.9 % bolus 1,000 mL (0 mLs Intravenous Stopped 12/04/18 1932)  piperacillin-tazobactam (ZOSYN) IVPB 3.375 g (0 g Intravenous Stopped 12/04/18 2019)    Mobility walks

## 2018-12-04 NOTE — ED Notes (Signed)
This NT searched for pt's belongings in ED and could not find them. Looked through notes to see if there was any charting of where belongings may be but there was none charted. Pt sent to floor with no belongings.

## 2018-12-04 NOTE — ED Notes (Signed)
Spoke with Kristina Knox with phlebotomy again about blood draw. Pt given Gatorade and encouraged to drink.

## 2018-12-05 ENCOUNTER — Other Ambulatory Visit: Payer: Self-pay

## 2018-12-05 DIAGNOSIS — K8050 Calculus of bile duct without cholangitis or cholecystitis without obstruction: Secondary | ICD-10-CM

## 2018-12-05 DIAGNOSIS — K8011 Calculus of gallbladder with chronic cholecystitis with obstruction: Secondary | ICD-10-CM

## 2018-12-05 LAB — COMPREHENSIVE METABOLIC PANEL
ALT: 132 U/L — AB (ref 0–44)
AST: 178 U/L — AB (ref 15–41)
Albumin: 2.4 g/dL — ABNORMAL LOW (ref 3.5–5.0)
Alkaline Phosphatase: 1078 U/L — ABNORMAL HIGH (ref 38–126)
Anion gap: 7 (ref 5–15)
BUN: 8 mg/dL (ref 6–20)
CO2: 22 mmol/L (ref 22–32)
CREATININE: 0.42 mg/dL — AB (ref 0.44–1.00)
Calcium: 8.3 mg/dL — ABNORMAL LOW (ref 8.9–10.3)
Chloride: 110 mmol/L (ref 98–111)
GFR calc Af Amer: >60 mL/min (ref >60)
GFR calc non Af Amer: >60 mL/min (ref >60)
Glucose, Bld: 112 mg/dL — ABNORMAL HIGH (ref 70–99)
Potassium: 4 mmol/L (ref 3.5–5.1)
Sodium: 139 mmol/L (ref 135–145)
Total Bilirubin: 6.4 mg/dL — ABNORMAL HIGH (ref 0.3–1.2)
Total Protein: 5.8 g/dL — ABNORMAL LOW (ref 6.5–8.1)

## 2018-12-05 LAB — CBC
HCT: 27.4 % — ABNORMAL LOW (ref 36.0–46.0)
Hemoglobin: 9.1 g/dL — ABNORMAL LOW (ref 12.0–15.0)
MCH: 32.2 pg (ref 26.0–34.0)
MCHC: 33.2 g/dL (ref 30.0–36.0)
MCV: 96.8 fL (ref 80.0–100.0)
Platelets: 459 10*3/uL — ABNORMAL HIGH (ref 150–400)
RBC: 2.83 MIL/uL — ABNORMAL LOW (ref 3.87–5.11)
RDW: 17 % — ABNORMAL HIGH (ref 11.5–15.5)
WBC: 11.5 10*3/uL — ABNORMAL HIGH (ref 4.0–10.5)
nRBC: 0 % (ref 0.0–0.2)

## 2018-12-05 LAB — LIPASE, BLOOD: Lipase: 118 U/L — ABNORMAL HIGH (ref 11–51)

## 2018-12-05 LAB — HIV ANTIBODY (ROUTINE TESTING W REFLEX): HIV Screen 4th Generation wRfx: NONREACTIVE

## 2018-12-05 MED ORDER — DIVALPROEX SODIUM 250 MG PO DR TAB
1000.0000 mg | DELAYED_RELEASE_TABLET | Freq: Every day | ORAL | Status: DC
  Administered 2018-12-05 – 2018-12-12 (×8): 1000 mg via ORAL
  Filled 2018-12-05 (×8): qty 4

## 2018-12-05 MED ORDER — OLANZAPINE 5 MG PO TABS
10.0000 mg | ORAL_TABLET | Freq: Every day | ORAL | Status: DC
  Administered 2018-12-05 – 2018-12-12 (×8): 10 mg via ORAL
  Filled 2018-12-05 (×8): qty 2

## 2018-12-05 NOTE — Progress Notes (Signed)
Spoke to [employer], patient's brother.  Felicity Pellegrini states that he is the patient's POA.  He reports that he is going to fax copies of the POA papers to the unit this afternoon.

## 2018-12-05 NOTE — Progress Notes (Signed)
Patient ID: Kristina Knox, female   DOB: 14-Oct-1961, 58 y.o.   MRN: [id number]     Kristina Knox Mar 24, 1961  [id number].    Requesting MD: Kristina Dinning, MD Chief Complaint/Reason for Consult: Choledocholithiasis  HPI:  This is a 58 y.o. female with a past medical history significant for bipolar disorder who presented to the ER on 12/04/2018 for AMS and manic like behavior. Patients exam was reported to be unremarkable. TTS was consulted and patient was recommended for inpatient psychiatric placement, however screening labs were significant for elevated LFTs, ammonia and bilirubin. Further evaluation was ordered, including RUQ [place] and revealed multiple gallstones and sludge, pericholecystic fluid, gallbladder wall thickening, CBD gallstone with intrahepatic and extrahepatic bilary duct dilation and up to 56m CBD. GI was consulted and patient was started on abx; medicine admitted.  We were consulted today for evaluation as patient will require cholecystectomy after ERCP by GI.  Per ER note, counselor notes and internal medicine notes, patient was brought in by brother for erratic, paranoid and manic-like behavior.  Patient's brother had law enforcement assist him [facility]  Patient apparently has been off her bipolar medications since early 2019.  Upon questioning today the patient has pressured speech and is not able to give a coherent history. She talks about her home being destroyed, having to use the restroom frequently, and continually says "whatever" to questions asked.  Denies any current abdominal pain, nausea, vomiting, diarrhea.  She also reports that she is hungry and continually asked when she can eat.  No family at bedside.  No previous surgical history documented.  ROS: ROS  Please see HPI.  Otherwise all other systems are reviewed and negative.  History reviewed. No pertinent family history.  Past Medical History:  Diagnosis Date  .  Mood disorder (HPeever   . Seasonal allergies     History reviewed. No pertinent surgical history.  Social History:  reports that she has been smoking. She has never used smokeless tobacco. She reports that she does not drink alcohol or use drugs.  Allergies:  Allergies  Allergen Reactions  . Codeine Nausea And Vomiting    Medications Prior to Admission  Medication Sig Dispense Refill  . Ascorbic Acid (VITAMIN C PO) Take 1 tablet by mouth daily.    .Marland Kitchenibuprofen (ADVIL,MOTRIN) 200 MG tablet Take 400 mg by mouth daily as needed for moderate pain.    . Melatonin 5 MG TABS Take 5-10 mg by mouth at bedtime.    . Multiple Vitamins-Minerals (VISION FORMULA PO) Take 1 tablet by mouth daily.    . naproxen sodium (ALEVE) 220 MG tablet Take 440 mg by mouth daily as needed (pain).    . Omega-3 Fatty Acids (FISH OIL PO) Take 1 tablet by mouth daily.    .Marland KitchenPOTASSIUM PO Take 1 tablet by mouth daily.    . divalproex (DEPAKOTE) 500 MG DR tablet Take 2 tablets (1,000 mg total) by mouth at bedtime. (Patient not taking: Reported on 12/04/2018) 14 tablet 0  . fluPHENAZine (PROLIXIN) 5 MG tablet Take 1 tablet (5 mg total) by mouth at bedtime. (Patient not taking: Reported on 12/04/2018) 14 tablet 0  . OLANZapine (ZYPREXA) 10 MG tablet Take 1 tablet (10 mg total) by mouth at bedtime. (Patient not taking: Reported on 12/04/2018) 14 tablet 0     Physical Exam: Blood pressure 122/68, pulse 80, temperature 99.4 F (37.4 C), temperature source Oral, resp. rate 16, SpO2  100 %. General: WD, WN white female who is laying in bed in NAD HEENT: head is normocephalic, atraumatic.  Sclera are noninjected.  No icterus PERRL.  Ears and nose without any masses or lesions.  Mouth is pink and moist Heart: regular, rate, and rhythm.  Normal s1,s2. No obvious murmurs, gallops, or rubs noted.  Palpable radial and pedal pulses bilaterally Lungs: CTAB, no wheezes, rhonchi, or rales noted.  Respiratory effort nonlabored Abd: soft, NT  with deep palpation, ND, +BS, no masses, hernias, or organomegaly. No r/r/g. Negative Murphy's sign.  MS: all 4 extremities are symmetrical with no cyanosis, clubbing, or edema. Skin: Jaundice. Warm and dry with no masses, lesions, or rashes Psych: A&Ox2.  Pressured speech.  Unable to give coherent history.   Results for orders placed or performed during the hospital encounter of 12/04/18 (from the past 48 hour(s))  Urine rapid drug screen (hosp performed)     Status: None   Collection Time: 12/04/18 12:02 PM  Result Value Ref Range   Opiates NONE DETECTED NONE DETECTED   Cocaine NONE DETECTED NONE DETECTED   Benzodiazepines NONE DETECTED NONE DETECTED   Amphetamines NONE DETECTED NONE DETECTED   Tetrahydrocannabinol NONE DETECTED NONE DETECTED   Barbiturates NONE DETECTED NONE DETECTED    Comment: (NOTE) DRUG SCREEN FOR MEDICAL PURPOSES ONLY.  IF CONFIRMATION IS NEEDED FOR ANY PURPOSE, NOTIFY LAB WITHIN 5 DAYS. LOWEST DETECTABLE LIMITS FOR URINE DRUG SCREEN Drug Class                     Cutoff (ng/mL) Amphetamine and metabolites    1000 Barbiturate and metabolites    200 Benzodiazepine                 884 Tricyclics and metabolites     300 Opiates and metabolites        300 Cocaine and metabolites        300 THC                            50 Performed at [facility], [place] [address], [place], [place] [address]   Urinalysis, Routine w reflex microscopic     Status: Abnormal   Collection Time: 12/04/18 12:02 PM  Result Value Ref Range   Color, Urine AMBER (A) YELLOW    Comment: BIOCHEMICALS MAY BE AFFECTED BY COLOR   APPearance HAZY (A) CLEAR   Specific Gravity, Urine 1.010 1.005 - 1.030   pH 6.0 5.0 - 8.0   Glucose, UA NEGATIVE NEGATIVE mg/dL   Hgb urine dipstick NEGATIVE NEGATIVE   Bilirubin Urine NEGATIVE NEGATIVE   Ketones, ur NEGATIVE NEGATIVE mg/dL   Protein, ur NEGATIVE NEGATIVE mg/dL   Nitrite NEGATIVE NEGATIVE   Leukocytes, UA NEGATIVE  NEGATIVE    Comment: Performed at [facility], [place] [address] [place], [place] [address]  Comprehensive metabolic panel     Status: Abnormal   Collection Time: 12/04/18  3:23 PM  Result Value Ref Range   Sodium 139 135 - 145 mmol/L   Potassium 2.7 (LL) 3.5 - 5.1 mmol/L    Comment: CRITICAL RESULT CALLED TO, READ BACK BY AND VERIFIED WITH: Ouida Sills [id number] @ 1619 BY J SCOTTON    Chloride 104 98 - 111 mmol/L   CO2 27 22 - 32 mmol/L   Glucose, Bld 76 70 - 99 mg/dL   BUN 11 6 - 20 mg/dL  Creatinine, Ser 0.56 0.44 - 1.00 mg/dL   Calcium 8.4 (L) 8.9 - 10.3 mg/dL   Total Protein 6.7 6.5 - 8.1 g/dL   Albumin 2.9 (L) 3.5 - 5.0 g/dL   AST 196 (H) 15 - 41 U/L   ALT 154 (H) 0 - 44 U/L   Alkaline Phosphatase 1,222 (H) 38 - 126 U/L   Total Bilirubin 6.8 (H) 0.3 - 1.2 mg/dL   GFR calc non Af Amer >60 >60 mL/min   GFR calc Af Amer >60 >60 mL/min   Anion gap 8 5 - 15    Comment: Performed at [facility], [place] [address] [place], [place] [address]  Ethanol     Status: None   Collection Time: 12/04/18  3:23 PM  Result Value Ref Range   Alcohol, Ethyl (B) <10 <10 mg/dL    Comment: (NOTE) Lowest detectable limit for serum alcohol is 10 mg/dL. For medical purposes only. Performed at [facility], [place] [address] [place], [place] [address]   CBC with Diff     Status: Abnormal   Collection Time: 12/04/18  3:23 PM  Result Value Ref Range   WBC 10.4 4.0 - 10.5 K/uL   RBC 2.99 (L) 3.87 - 5.11 MIL/uL   Hemoglobin 9.9 (L) 12.0 - 15.0 g/dL   HCT 29.2 (L) 36.0 - 46.0 %   MCV 97.7 80.0 - 100.0 fL   MCH 33.1 26.0 - 34.0 pg   MCHC 33.9 30.0 - 36.0 g/dL   RDW 17.1 (H) 11.5 - 15.5 %   Platelets 572 (H) 150 - 400 K/uL   nRBC 0.0 0.0 - 0.2 %   Neutrophils Relative % 60 %   Neutro Abs 6.3 1.7 - 7.7 K/uL   Lymphocytes Relative 31 %   Lymphs Abs 3.2 0.7 - 4.0 K/uL   Monocytes Relative 8 %   Monocytes Absolute 0.8 0.1 - 1.0 K/uL    Eosinophils Relative 0 %   Eosinophils Absolute 0.0 0.0 - 0.5 K/uL   Basophils Relative 0 %   Basophils Absolute 0.0 0.0 - 0.1 K/uL   Immature Granulocytes 1 %   Abs Immature Granulocytes 0.06 0.00 - 0.07 K/uL    Comment: Performed at [facility], [place] [address] [place], [place] [address]  I-Stat beta hCG blood, ED     Status: None   Collection Time: 12/04/18  3:24 PM  Result Value Ref Range   I-stat hCG, quantitative <5.0 <5 mIU/mL   Comment 3            Comment:   GEST. AGE      CONC.  (mIU/mL)   <=1 WEEK        5 - 50     2 WEEKS       50 - 500     3 WEEKS       100 - 10,000     4 WEEKS     1,000 - 30,000        FEMALE AND NON-PREGNANT FEMALE:     LESS THAN 5 mIU/mL   Lipase, blood     Status: Abnormal   Collection Time: 12/04/18  5:02 PM  Result Value Ref Range   Lipase 86 (H) 11 - 51 U/L    Comment: Performed at [facility], [place] [address], [place], [place] [address]  Hepatic function panel     Status: Abnormal   Collection Time: 12/04/18  5:02 PM  Result Value Ref Range   Total Protein 6.7 6.5 - 8.1 g/dL   Albumin 2.8 (L) 3.5 - 5.0 g/dL   AST 190 (H) 15 - 41 U/L   ALT 150 (H) 0 - 44 U/L   Alkaline Phosphatase 1,223 (H) 38 - 126 U/L   Total Bilirubin 6.5 (H) 0.3 - 1.2 mg/dL   Bilirubin, Direct 3.8 (H) 0.0 - 0.2 mg/dL   Indirect Bilirubin 2.7 (H) 0.3 - 0.9 mg/dL    Comment: Performed at [facility], [place] [address] [place], [place] [address]  Acetaminophen level     Status: Abnormal   Collection Time: 12/04/18  5:02 PM  Result Value Ref Range   Acetaminophen (Tylenol), Serum <10 (L) 10 - 30 ug/mL    Comment: (NOTE) Therapeutic concentrations vary significantly. A range of 10-30 ug/mL  may be an effective concentration for many patients. However, some  are best treated at concentrations outside of this range. Acetaminophen concentrations >150 ug/mL at 4 hours after ingestion  and >50 ug/mL at 12 hours after  ingestion are often associated with  toxic reactions. Performed at [facility], [place] [address] [place], [place] [address]   Salicylate level     Status: None   Collection Time: 12/04/18  5:02 PM  Result Value Ref Range   Salicylate Lvl <2.8 2.8 - 30.0 mg/dL    Comment: Performed at [facility], [place] [address], [place], [place] [address]  Ammonia     Status: Abnormal   Collection Time: 12/04/18  5:02 PM  Result Value Ref Range   Ammonia 50 (H) 9 - 35 umol/L    Comment: Performed at [facility], [place] [address], [place], [place] [address]  Protime-INR     Status: None   Collection Time: 12/04/18  5:02 PM  Result Value Ref Range   Prothrombin Time 12.2 11.4 - 15.2 seconds   INR 0.91     Comment: Performed at [facility], [place] [address] [place], [place] [address]  Blood culture (routine x 2)     Status: None (Preliminary result)   Collection Time: 12/04/18  7:50 PM  Result Value Ref Range   Specimen Description      BLOOD RIGHT ANTECUBITAL Performed at [facility], [place] [address], [place], [place] [address]    Special Requests      BOTTLES DRAWN AEROBIC AND ANAEROBIC Blood Culture adequate volume Performed at [place] [address], [place], [place] [address]    Culture      NO GROWTH < 12 HOURS Performed at [place] [address], [place], [place] [address]    Report Status PENDING   Blood culture (routine x 2)     Status: None (Preliminary result)   Collection Time: 12/04/18  7:50 PM  Result Value Ref Range   Specimen Description      BLOOD LEFT ANTECUBITAL Performed at [place] [address], [place], [place] [address]    Special Requests      BOTTLES DRAWN AEROBIC AND ANAEROBIC Blood Culture adequate volume Performed at [place] [address], [place], [place] [address]    Culture        NO GROWTH < 12 HOURS Performed at [place] [address] [place], [place] [address]    Report Status PENDING   Magnesium     Status: None  Collection Time: 12/04/18  9:29 PM  Result Value Ref Range   Magnesium 1.7 1.7 - 2.4 mg/dL    Comment: Performed at [facility], [place] [address], [place], [place] [address]  Iron and TIBC     Status: None   Collection Time: 12/04/18 10:34 PM  Result Value Ref Range   Iron 49 28 - 170 ug/dL   TIBC 256 250 - 450 ug/dL   Saturation Ratios 19 10.4 - 31.8 %   UIBC 207 ug/dL    Comment: Performed at [facility], [place] [address] [place], [place] [address]  Ferritin     Status: None   Collection Time: 12/04/18 10:34 PM  Result Value Ref Range   Ferritin 151 11 - 307 ng/mL    Comment: Performed at [facility], [place] [address] [place], [place] [address]  Vitamin B12     Status: Abnormal   Collection Time: 12/04/18 10:34 PM  Result Value Ref Range   Vitamin B-12 1,440 (H) 180 - 914 pg/mL    Comment: (NOTE) This assay is not validated for testing neonatal or myeloproliferative syndrome specimens for Vitamin B12 levels. Performed at [facility], [place] [address] [place], [place] [address]   Folate     Status: None   Collection Time: 12/04/18 10:34 PM  Result Value Ref Range   Folate 10.6 >5.9 ng/mL    Comment: Performed at [facility], [place] [address] [place], [place] [address]  Lactate dehydrogenase     Status: Abnormal   Collection Time: 12/04/18 10:34 PM  Result Value Ref Range   LDH 199 (H) 98 - 192 U/L    Comment: Performed at [facility], [place] [address] [place], [place] [address]  Comprehensive metabolic panel     Status: Abnormal   Collection Time: 12/05/18  4:30 AM  Result Value Ref Range   Sodium 139 135 - 145 mmol/L   Potassium 4.0 3.5 - 5.1 mmol/L    Comment: DELTA CHECK NOTED REPEATED TO VERIFY NO  VISIBLE HEMOLYSIS    Chloride 110 98 - 111 mmol/L   CO2 22 22 - 32 mmol/L   Glucose, Bld 112 (H) 70 - 99 mg/dL   BUN 8 6 - 20 mg/dL   Creatinine, Ser 0.42 (L) 0.44 - 1.00 mg/dL   Calcium 8.3 (L) 8.9 - 10.3 mg/dL   Total Protein 5.8 (L) 6.5 - 8.1 g/dL   Albumin 2.4 (L) 3.5 - 5.0 g/dL   AST 178 (H) 15 - 41 U/L   ALT 132 (H) 0 - 44 U/L   Alkaline Phosphatase 1,078 (H) 38 - 126 U/L   Total Bilirubin 6.4 (H) 0.3 - 1.2 mg/dL   GFR calc non Af Amer >60 >60 mL/min   GFR calc Af Amer >60 >60 mL/min   Anion gap 7 5 - 15    Comment: Performed at [facility] [place] [address], [place], [place] [address]  CBC     Status: Abnormal   Collection Time: 12/05/18  4:30 AM  Result Value Ref Range   WBC 11.5 (H) 4.0 - 10.5 K/uL   RBC 2.83 (L) 3.87 - 5.11 MIL/uL   Hemoglobin 9.1 (L) 12.0 - 15.0 g/dL   HCT 27.4 (L) 36.0 - 46.0 %   MCV 96.8 80.0 - 100.0 fL   MCH 32.2 26.0 - 34.0 pg   MCHC 33.2 30.0 - 36.0 g/dL   RDW 17.0 (H)  11.5 - 15.5 %   Platelets 459 (H) 150 - 400 K/uL   nRBC 0.0 0.0 - 0.2 %    Comment: Performed at [facility], [place] [address], [place], [place] [address]  Lipase, blood     Status: Abnormal   Collection Time: 12/05/18  4:30 AM  Result Value Ref Range   Lipase 118 (H) 11 - 51 U/L    Comment: Performed at [facility], [place] [address] [place], [place] [address]   Dg Chest 2 View  Result Date: 12/04/2018 CLINICAL DATA:  Medical clearance EXAM: CHEST - 2 VIEW COMPARISON:  None. FINDINGS: Lungs are clear. The heart size and pulmonary vascularity are normal. No adenopathy. There is midthoracic levoscoliosis. IMPRESSION: No edema or consolidation. Electronically Signed   By: Lowella Grip III M.D.   On: 12/04/2018 14:35   US Abdomen Limited Ruq  Result Date: 12/04/2018 CLINICAL DATA:  Elevated LFTs.  Confusion.  Altered mental status. EXAM: ULTRASOUND ABDOMEN LIMITED RIGHT UPPER QUADRANT COMPARISON:  None. FINDINGS:  Gallbladder: There are multiple gallstones, with sludge. Gallbladder wall thickness is increased, 3.9 mm, with suspected pericholecystic fluid. No definite sonographic Murphy's sign. Common bile duct: Diameter: Common bile duct is abnormally enlarged up to 16 mm. There is a stone within the dilated common bile duct measuring 13 mm in largest dimension. Liver: Intrahepatic biliary ductal dilatation is present. Echogenicity is increased. Portal vein is patent on color Doppler imaging with normal direction of blood flow towards the liver. IMPRESSION: Multiple gallstones with sludge. Common bile duct gallstones with intrahepatic and extrahepatic biliary ductal dilatation, up to 16 mm CBD. Pericholecystic fluid, and gallbladder wall thickening without reported sonographic Murphy's sign. Concern raised for acute cholecystitis. These results were called by telephone at the time of interpretation on 12/04/2018 at 7:04 pm to Dr. Marda Stalker , who verbally acknowledged these results. Electronically Signed   By: Staci Righter M.D.   On: 12/04/2018 19:08      Assessment/Plan Choledocholithiasis -Patient with elevated LFTs; Alk phos 1078, ALT 132, AST 178, total bilirubin 6.4.  -Imaging with multiple gallstones with sludge, pericholecystic fluid, gallbladder wall thickening.  Common bile duct stone and dilatation to 16 mm. Pt did have abnormal RUQ [place] in 2018 as well. -Patient will likely need ERCP by GI -Will likely require cholecystectomy after ERCP -Continue abx -Patient is awaiting psychiatric consult as she is not a reliable historian.  I agree with this and feel this is appropriate.  Altered mental status -Competency  pending psych eval -Unclear etiology  FEN - NPO VTE - SCD ID - [place], [facility] 12/05/2018, 12:13 PM Pager: [phone]

## 2018-12-05 NOTE — Progress Notes (Signed)
Patient refused to let RN remove socks for patient assessment.  Will continue to monitor.

## 2018-12-05 NOTE — Consult Note (Addendum)
[facility] Attending   I have taken an interval history, reviewed the chart and examined the patient. I agree with the Advanced Practitioner's note, impression and recommendations.   She is manic and has incidental chronic choledocholithiasis and cholelithiasis. I doubt she has cholecystitis or cholangitis. She will need an ERCP - it is not urgent. Will aim for Thursday or Friday. I ordered a low fat diet.  Will need to get consent from [facility]  Gatha Mayer, MD, [facility] 12/05/2018 4:02 PM Pager [phone]      Consultation  Referring Provider:  Dr. Herbert Moors    Primary Care Physician:  Lujean Amel, MD Primary Gastroenterologist: Althia Forts         Reason for Consultation: Choledocholithiasis, Elevated LFT's              HPI:   Kristina Knox is a 58 y.o. female with a past medical history significant for bipolar disorder who presented to the ER on 12/04/2018 with altered mental status.  We were consulted in regards to elevated LFTs and imaging showing choledocholithiasis.    Per ER physician patient son found the patient yesterday acting quite odd and talking about really weird things.  They brought her to the ED to be seen by psych.     Today, it is clear that the patient has altered mental status.  She tells me her name and "Kristina Knox", she is able to tell me her birthday and where she is but other than that she talks extraneously.  She is unable to tell me anything about her GI symptoms, tells me that anything she eats goes through her but denies diarrhea.  Overall unreliable.  Denies abdominal pain.  ER course: Potassium was 2.7, right upper quadrant ultrasound with multiple gallstones and sludge, common bile duct gallstones with intrahepatic and extrahepatic biliary ductal dilatation, up to 32m common bile duct, pericholecystic fluid and gallbladder wall thickening without reported sonographic MPercell Millersign, concern for acute cholecystitis (09/06/2017  right upper quadrant ultrasound with abnormal gallbladder, common bile duct and pancreatic head at that time recommended MRCP and MRI, patient never had these done, 15 mm stone in common bile duct, gallbladder wall thickening) ; given 1 dose of Zosyn, AST 190, ALT 150, alk phos 1223, bili 6.5, albumin 2.8, lipase elevated 86, ammonia 50, CBC with a hemoglobin of 9.9  GI History: None  History garnered from chart due to AMS: Past Medical History:  Diagnosis Date  . Mood disorder (HAnderson   . Seasonal allergies     History reviewed. No pertinent surgical history.  History reviewed. No pertinent family history.  Social History   Tobacco Use  . Smoking status: Current Every Day Smoker  . Smokeless tobacco: Never Used  Substance Use Topics  . Alcohol use: No  . Drug use: No    Prior to Admission medications   Medication Sig Start Date End Date Taking? Authorizing Provider  Ascorbic Acid (VITAMIN C PO) Take 1 tablet by mouth daily.   Yes [provider]  ibuprofen (ADVIL,MOTRIN) 200 MG tablet Take 400 mg by mouth daily as needed for moderate pain.   Yes [provider]  Melatonin 5 MG TABS Take 5-10 mg by mouth at bedtime.   Yes [provider]  Multiple Vitamins-Minerals (VISION FORMULA PO) Take 1 tablet by mouth daily.   Yes [provider]  naproxen sodium (ALEVE) 220 MG tablet Take 440 mg by mouth daily as needed (pain).   Yes  [provider]  Omega-3 Fatty Acids (FISH OIL PO) Take 1 tablet by mouth daily.   Yes [provider]  POTASSIUM PO Take 1 tablet by mouth daily.   Yes [provider]  divalproex (DEPAKOTE) 500 MG DR tablet Take 2 tablets (1,000 mg total) by mouth at bedtime. Patient not taking: Reported on 12/04/2018 05/08/15   Waldemar Dickens, MD  fluPHENAZine (PROLIXIN) 5 MG tablet Take 1 tablet (5 mg total) by mouth at bedtime. Patient not taking: Reported on 12/04/2018 05/08/15   Waldemar Dickens, MD  OLANZapine  (ZYPREXA) 10 MG tablet Take 1 tablet (10 mg total) by mouth at bedtime. Patient not taking: Reported on 12/04/2018 05/08/15   Waldemar Dickens, MD    Current Facility-Administered Medications  Medication Dose Route Frequency Provider Last Rate Last Dose  . 0.9 % NaCl with KCl 20 mEq/ L  infusion   Intravenous Continuous Johnson-Pitts, Endia, MD 75 mL/hr at 12/04/18 2226    . acetaminophen (TYLENOL) tablet 650 mg  650 mg Oral Q6H PRN Johnson-Pitts, Endia, MD       Or  . acetaminophen (TYLENOL) suppository 650 mg  650 mg Rectal Q6H PRN Johnson-Pitts, Endia, MD      . ondansetron (ZOFRAN) tablet 4 mg  4 mg Oral Q6H PRN Johnson-Pitts, Endia, MD       Or  . ondansetron (ZOFRAN) injection 4 mg  4 mg Intravenous Q6H PRN Johnson-Pitts, Endia, MD      . piperacillin-tazobactam (ZOSYN) IVPB 3.375 g  3.375 g Intravenous Q8H Johnson-Pitts, Endia, MD 12.5 mL/hr at 12/05/18 0911 3.375 g at 12/05/18 0911  . polyethylene glycol (MIRALAX / GLYCOLAX) packet 17 g  17 g Oral Daily PRN Johnson-Pitts, Endia, MD        Allergies as of 12/04/2018 - Review Complete 12/04/2018  Allergen Reaction Noted  . Codeine Nausea And Vomiting 05/08/2015     Review of Systems:    Unable to complete due to AMS    Physical Exam:  Vital signs in last 24 hours: Temp:  [97.9 F (36.6 C)-99.4 F (37.4 C)] 99.4 F (37.4 C) (01/07 0738) Pulse Rate:  [70-90] 80 (01/07 0738) Resp:  [9-19] 16 (01/07 0738) BP: (119-147)/(66-112) 122/68 (01/07 0738) SpO2:  [100 %] 100 % (01/07 0738) Last BM Date: 12/04/18 General:  Caucasian female appears to be in NAD, Well developed, Well nourished, alert, AMS Head:  Normocephalic and atraumatic. Eyes:   PEERL, EOMI. No icterus. Conjunctiva pink. Ears:  Normal auditory acuity. Neck:  Supple Throat: Oral cavity and pharynx without inflammation, swelling or lesion.  Lungs: Respirations even and unlabored. Lungs clear to auscultation bilaterally.   No wheezes, crackles, or rhonchi.  Heart:  Normal S1, S2. No MRG. Regular rate and rhythm. No peripheral edema, cyanosis or pallor.  Abdomen:  Soft, nondistended, nontender. No rebound or guarding. Normal bowel sounds. No appreciable masses or hepatomegaly. Rectal:  Not performed.  Msk:  Symmetrical without gross deformities. Peripheral pulses intact.  Extremities:  Without edema, no deformity or joint abnormality.  Neurologic:  Alert and  oriented x1;  grossly normal neurologically.   Skin:   Dry and intact without significant lesions or rashes. Psychiatric: AMS, Odd demeanor   LAB RESULTS: Recent Labs    12/04/18 1523 12/05/18 0430  WBC 10.4 11.5*  HGB 9.9* 9.1*  HCT 29.2* 27.4*  PLT 572* 459*   BMET Recent Labs    12/04/18 1523 12/05/18 0430  NA 139 139  K 2.7* 4.0  CL 104 110  CO2 27 22  GLUCOSE 76 112*  BUN 11 8  CREATININE 0.56 0.42*  CALCIUM 8.4* 8.3*   LFT Recent Labs    12/04/18 1702 12/05/18 0430  PROT 6.7 5.8*  ALBUMIN 2.8* 2.4*  AST 190* 178*  ALT 150* 132*  ALKPHOS 1,223* 1,078*  BILITOT 6.5* 6.4*  BILIDIR 3.8*  --   IBILI 2.7*  --    PT/INR Recent Labs    12/04/18 1702  LABPROT 12.2  INR 0.91    STUDIES: Dg Chest 2 View  Result Date: 12/04/2018 CLINICAL DATA:  Medical clearance EXAM: CHEST - 2 VIEW COMPARISON:  None. FINDINGS: Lungs are clear. The heart size and pulmonary vascularity are normal. No adenopathy. There is midthoracic levoscoliosis. IMPRESSION: No edema or consolidation. Electronically Signed   By: Lowella Grip III M.D.   On: 12/04/2018 14:35   US Abdomen Limited Ruq  Result Date: 12/04/2018 CLINICAL DATA:  Elevated LFTs.  Confusion.  Altered mental status. EXAM: ULTRASOUND ABDOMEN LIMITED RIGHT UPPER QUADRANT COMPARISON:  None. FINDINGS: Gallbladder: There are multiple gallstones, with sludge. Gallbladder wall thickness is increased, 3.9 mm, with suspected pericholecystic fluid. No definite sonographic Murphy's sign. Common bile duct: Diameter: Common bile duct is  abnormally enlarged up to 16 mm. There is a stone within the dilated common bile duct measuring 13 mm in largest dimension. Liver: Intrahepatic biliary ductal dilatation is present. Echogenicity is increased. Portal vein is patent on color Doppler imaging with normal direction of blood flow towards the liver. IMPRESSION: Multiple gallstones with sludge. Common bile duct gallstones with intrahepatic and extrahepatic biliary ductal dilatation, up to 16 mm CBD. Pericholecystic fluid, and gallbladder wall thickening without reported sonographic Murphy's sign. Concern raised for acute cholecystitis. These results were called by telephone at the time of interpretation on 12/04/2018 at 7:04 pm to Dr. Marda Stalker , who verbally acknowledged these results. Electronically Signed   By: Staci Righter M.D.   On: 12/04/2018 19:08    Impression / Plan:   Impression: 1.  Elevated LFTs: As above, Alk phos 1078, ALT 132, AST 178, total bilirubin, imaging showing choledocholithiasis (interestingly similar to 2018), patient does not complain of abdominal pain, stable 2.  Abnormal right upper quadrant ultrasound 3.  Altered mental status: Likely multifactorial, large relation to bipolar disease and being off of medications  Plan: 1.  At the moment as patient is not in any pain and stable, will wait on ERCP.  Hopefully psych will be able to see patient and determine treatment for AMS. 2.  If patient does need ERCP will need to contact relatives as I am unsure if she will be able to provide her own consent 3.  Continue to monitor LFTs 4.  Continue antibiotics 5.  Please await any further recommendations from Dr. Carlean Purl later today  Thank you for your kind consultation, we will continue to follow.  Lavone Nian [facility]  12/05/2018, 9:17 AM

## 2018-12-05 NOTE — Progress Notes (Signed)
Belongings returned to pt including clothing, purse, and water bottle. This RN informed pt that her cigarettes and lighter were locked in a cabinet on [address] and will be given back to her upon discharge. Will pass on information to oncoming shift.

## 2018-12-05 NOTE — Progress Notes (Signed)
POA papers received from patient's brother.  Placed on chart.

## 2018-12-05 NOTE — Progress Notes (Signed)
PROGRESS NOTE    Kristina Knox  WUJ:[id number] DOB: 11/19/1961 DOA: 12/04/2018 PCP: [name], Dibas, MD    Brief Narrative:  58 year old with past medical history relevant for bipolar disorder brought into the emergency department for altered mental status.  Psychiatric screening labs found mixed cholestatic and hepatocellular (predominantly cholestatic) pattern of liver injury and ultrasound showed choledocholithiasis as well as cholecystitis.   Assessment & Plan:   Principal Problem:   Choledocholithiasis with acute cholecystitis Active Problems:   Bipolar disorder (HCC)   Hypokalemia   Acute metabolic encephalopathy   Anemia   Elevated liver function tests   #) Acute cholecystitis/choledocholithiasis: Patient does not appear to be particularly symptomatic at this time.  Unfortunately getting reliable history is limited by the fact that there are not any comparison in the room and that she herself -N.p.o. -IV fluids -Continue IV Zosyn -[facility] consulted for ERCP -We will discuss with general surgery for cholecystectomy  #) Altered mental status: Unclear if patient has metabolic encephalopathy versus flaring of her bipolar disorder.  Currently with flank pain.  That she has some symptoms of bipolar mania though her baseline is unclear.  Will readdress after patient has had definitive treatment.  #) Bipolar disorder: Patient reportedly has history of bipolar disorder and is on multiple medications for this.  Per above it is unclear if patient is having a tach episode or is encephalopathic from her infection/choledocholithiasis.  Per the medicine reconciliation patient has not been taking any of her psychiatric medications. -Restart divalproex thousand milligrams nightly - Restart olanzapine 10 mg nightly - Hold fluphenazine 5 mg nightly  Fluids: Gentle IV fluids Electrolytes: Monitor and supplement Nutrition: N.p.o. currently  Prophylaxis: SCDs  Disposition: Pending  resolution of choledocholithiasis  Full code   Consultants:   [facility]  Procedures:   Pending ERCP 12/05/2018  Antimicrobials:   IV Zosyn started 12/05/2019   Subjective: This morning patient is somewhat tangential.  She reports some pain but does not tolerate much.  She appears to mostly be talking about her living situation.  She otherwise denies any nausea but does report some diarrhea.  She denies any hematochezia or melena.  She denies any chest pain, cough.  Objective: Vitals:   12/04/18 2000 12/04/18 2030 12/05/18 0559 12/05/18 0738  BP: 140/79 131/66 119/85 122/68  Pulse: 74 76 86 80  Resp: 12 12 18 16   Temp:   98.9 F (37.2 C) 99.4 F (37.4 C)  TempSrc:   Oral Oral  SpO2: 100% 100% 100% 100%    Intake/Output Summary (Last 24 hours) at 12/05/2018 1054 Last data filed at 12/05/2018 0200 Gross per 24 hour  Intake 2019.3 ml  Output -  Net 2019.3 ml   There were no vitals filed for this visit.  Examination:  General exam: Appears calm and comfortable  Respiratory system: Clear to auscultation. Respiratory effort normal. Cardiovascular system: Regular rate and rhythm, no murmurs Gastrointestinal system: Soft, nondistended, no rebound or guarding, mild right upper quadrant tenderness, diminished bowel sounds Central nervous system: Alert and oriented to person, place but not to situation, grossly intact, moving all extremities Extremities: No lower extremity edema. Skin: No rashes over visible skin Psychiatry: Judgement and insight appear impaired, mood and affect appear tangential    Data Reviewed: I have personally reviewed following labs and imaging studies  CBC: Recent Labs  Lab 12/04/18 1523 12/05/18 0430  WBC 10.4 11.5*  NEUTROABS 6.3  --   HGB 9.9* 9.1*  HCT 29.2* 27.4*  MCV 97.7 96.8  PLT 572* 459*   Basic Metabolic Panel: Recent Labs  Lab 12/04/18 1523 12/04/18 2129 12/05/18 0430  NA 139  --  139  K 2.7*  --  4.0  CL 104  --  110    CO2 27  --  22  GLUCOSE 76  --  112*  BUN 11  --  8  CREATININE 0.56  --  0.42*  CALCIUM 8.4*  --  8.3*  MG  --  1.7  --    GFR: CrCl cannot be calculated (Unknown ideal weight.). Liver Function Tests: Recent Labs  Lab 12/04/18 1523 12/04/18 1702 12/05/18 0430  AST 196* 190* 178*  ALT 154* 150* 132*  ALKPHOS 1,222* 1,223* 1,078*  BILITOT 6.8* 6.5* 6.4*  PROT 6.7 6.7 5.8*  ALBUMIN 2.9* 2.8* 2.4*   Recent Labs  Lab 12/04/18 1702 12/05/18 0430  LIPASE 86* 118*   Recent Labs  Lab 12/04/18 1702  AMMONIA 50*   Coagulation Profile: Recent Labs  Lab 12/04/18 1702  INR 0.91   Cardiac Enzymes: No results for input(s): CKTOTAL, CKMB, CKMBINDEX, TROPONINI in the last 168 hours. BNP (last 3 results) No results for input(s): PROBNP in the last 8760 hours. HbA1C: No results for input(s): HGBA1C in the last 72 hours. CBG: No results for input(s): GLUCAP in the last 168 hours. Lipid Profile: No results for input(s): CHOL, HDL, LDLCALC, TRIG, CHOLHDL, LDLDIRECT in the last 72 hours. Thyroid Function Tests: No results for input(s): TSH, T4TOTAL, FREET4, T3FREE, THYROIDAB in the last 72 hours. Anemia Panel: Recent Labs    12/04/18 2234  VITAMINB12 1,440*  FOLATE 10.6  FERRITIN 151  TIBC 256  IRON 49   Sepsis Labs: No results for input(s): PROCALCITON, LATICACIDVEN in the last 168 hours.  Recent Results (from the past 240 hour(s))  Blood culture (routine x 2)     Status: None (Preliminary result)   Collection Time: 12/04/18  7:50 PM  Result Value Ref Range Status   Specimen Description   Final    BLOOD RIGHT ANTECUBITAL Performed at [facility], [address] [place], [place] [address]    Special Requests   Final    BOTTLES DRAWN AEROBIC AND ANAEROBIC Blood Culture adequate volume Performed at [facility], [address] [place], [place] [address]    Culture   Final    NO GROWTH < 12 HOURS Performed at [facility], [address] [place], [place] [address]    Report Status PENDING  Incomplete  Blood culture (routine x 2)     Status: None (Preliminary result)   Collection Time: 12/04/18  7:50 PM  Result Value Ref Range Status   Specimen Description   Final    BLOOD LEFT ANTECUBITAL Performed at [facility], [address] [place], [place] [address]    Special Requests   Final    BOTTLES DRAWN AEROBIC AND ANAEROBIC Blood Culture adequate volume Performed at [facility], [address] [place], [place] [address]    Culture   Final    NO GROWTH < 12 HOURS Performed at [facility], [address] [place], [place] [address]    Report Status PENDING  Incomplete         Radiology Studies: Dg Chest 2 View  Result Date: 12/04/2018 CLINICAL DATA:  Medical clearance EXAM: CHEST - 2 VIEW COMPARISON:  None. FINDINGS: Lungs are clear. The heart size and  pulmonary vascularity are normal. No adenopathy. There is midthoracic levoscoliosis. IMPRESSION: No edema or consolidation. Electronically Signed   By: Bretta Bang III M.D.   On: 12/04/2018 14:35   US Abdomen Limited Ruq  Result Date: 12/04/2018 CLINICAL DATA:  Elevated LFTs.  Confusion.  Altered mental status. EXAM: ULTRASOUND ABDOMEN LIMITED RIGHT UPPER QUADRANT COMPARISON:  None. FINDINGS: Gallbladder: There are multiple gallstones, with sludge. Gallbladder wall thickness is increased, 3.9 mm, with suspected pericholecystic fluid. No definite sonographic Murphy's sign. Common bile duct: Diameter: Common bile duct is abnormally enlarged up to 16 mm. There is a stone within the dilated common bile duct measuring 13 mm in largest dimension. Liver: Intrahepatic biliary ductal dilatation is present. Echogenicity is increased. Portal vein is patent on color Doppler imaging with normal direction of blood flow towards the liver. IMPRESSION: Multiple gallstones with sludge. Common bile duct gallstones  with intrahepatic and extrahepatic biliary ductal dilatation, up to 16 mm CBD. Pericholecystic fluid, and gallbladder wall thickening without reported sonographic Murphy's sign. Concern raised for acute cholecystitis. These results were called by telephone at the time of interpretation on 12/04/2018 at 7:04 pm to Dr. Lynden Oxford , who verbally acknowledged these results. Electronically Signed   By: Elsie Stain M.D.   On: 12/04/2018 19:08        Scheduled Meds: Continuous Infusions: . 0.9 % NaCl with KCl 20 mEq / L 75 mL/hr at 12/04/18 2226  . piperacillin-tazobactam (ZOSYN)  IV 3.375 g (12/05/18 0911)     LOS: 1 day    Time spent: 35    Delaine Lame, MD [facility]  If 7PM-7AM, please contact night-coverage [email or web address] Password [facility] 12/05/2018, 10:54 AM

## 2018-12-06 ENCOUNTER — Other Ambulatory Visit: Payer: Self-pay | Admitting: Internal Medicine

## 2018-12-06 LAB — COMPREHENSIVE METABOLIC PANEL
ALT: 141 U/L — ABNORMAL HIGH (ref 0–44)
AST: 195 U/L — ABNORMAL HIGH (ref 15–41)
Albumin: 2.4 g/dL — ABNORMAL LOW (ref 3.5–5.0)
Alkaline Phosphatase: 1103 U/L — ABNORMAL HIGH (ref 38–126)
Anion gap: 7 (ref 5–15)
BUN: 11 mg/dL (ref 6–20)
CO2: 25 mmol/L (ref 22–32)
Calcium: 8.4 mg/dL — ABNORMAL LOW (ref 8.9–10.3)
Chloride: 110 mmol/L (ref 98–111)
GFR calc Af Amer: >60 mL/min (ref >60)
GFR calc non Af Amer: >60 mL/min (ref >60)
Glucose, Bld: 119 mg/dL — ABNORMAL HIGH (ref 70–99)
Potassium: 4.2 mmol/L (ref 3.5–5.1)
Sodium: 142 mmol/L (ref 135–145)
Total Bilirubin: 7 mg/dL — ABNORMAL HIGH (ref 0.3–1.2)
Total Protein: 5.8 g/dL — ABNORMAL LOW (ref 6.5–8.1)

## 2018-12-06 LAB — MAGNESIUM: Magnesium: 2.1 mg/dL (ref 1.7–2.4)

## 2018-12-06 LAB — HAPTOGLOBIN: Haptoglobin: 70 mg/dL (ref 33–346)

## 2018-12-06 LAB — COMPREHENSIVE METABOLIC PANEL WITH GFR: Creatinine, Ser: 0.6 mg/dL (ref 0.44–1.00)

## 2018-12-06 LAB — CBC
HCT: 28 % — ABNORMAL LOW (ref 36.0–46.0)
Hemoglobin: 9.2 g/dL — ABNORMAL LOW (ref 12.0–15.0)
MCH: 32.7 pg (ref 26.0–34.0)
MCHC: 32.9 g/dL (ref 30.0–36.0)
MCV: 99.6 fL (ref 80.0–100.0)
Platelets: 392 10*3/uL (ref 150–400)
RBC: 2.81 MIL/uL — ABNORMAL LOW (ref 3.87–5.11)
RDW: 17.3 % — ABNORMAL HIGH (ref 11.5–15.5)
WBC: 8 10*3/uL (ref 4.0–10.5)
nRBC: 0 % (ref 0.0–0.2)

## 2018-12-06 MED ORDER — OLANZAPINE 5 MG PO TABS: 5.0000 mg | ORAL_TABLET | Freq: Every day | ORAL | Status: DC

## 2018-12-06 MED ORDER — OLANZAPINE 5 MG PO TABS
5.0000 mg | ORAL_TABLET | Freq: Every day | ORAL | Status: DC
  Administered 2018-12-07: 5 mg via ORAL
  Filled 2018-12-06: qty 1

## 2018-12-06 NOTE — Progress Notes (Signed)
   Patient Name: Kristina Knox Date of Encounter: 12/06/2018, 3:22 PM    Subjective  No abdominal pain   Objective  BP (!) 139/96 (BP Location: Left Arm)   Pulse 79   Temp 97.8 F (36.6 C) (Oral)   Resp 16   Ht 5\' 8"  (1.727 m)   Wt 47.9 kg   SpO2 100%   BMI 16.06 kg/m  Still with psychomotor agitation, rapid speech that is tangential  Lab Results  Component Value Date   ALT 141 (H) 12/06/2018   AST 195 (H) 12/06/2018   ALKPHOS 1,103 (H) 12/06/2018   BILITOT 7.0 (H) 12/06/2018       Assessment and Plan  Chronic choledocholithiasis, cholelthiasis ? cholecystiis (suspect chronic) Bipolar - off Tx  She needs an ERCP It is not urgent I would prefer to have psych eval to review - I have some concerns about interpreting her clinical situation post-procedure in her current mental state though am not opposed to doing the ERCP Friday it is not medically urgent.  Iva Booparl E. Hershy Flenner, MD, [facility] 12/06/2018 3:22 PM Pager [phone]

## 2018-12-06 NOTE — Progress Notes (Signed)
PT Cancellation Note  Patient Details Name: Kristina Knox MRN: [id number] DOB: 1961-08-15   Cancelled Treatment:    Reason Eval/Treat Not Completed: Order received. Chart reviewed. Pt evaluated on 12/04/18 and was Ind with mobility. Please reorder if mobility needs have changed. Will sign off.    Rebeca Alert, PT Acute Rehabilitation Services Pager: [phone] Office: [phone]

## 2018-12-06 NOTE — Progress Notes (Signed)
PROGRESS NOTE    Kristina Knox  FGH:[id number] DOB: 1961/09/07 DOA: 12/04/2018 PCP: Darrow Bussing, MD    Brief Narrative:  58 year old with past medical history relevant for bipolar disorder brought into the emergency department for altered mental status.  Psychiatric screening labs found mixed cholestatic and hepatocellular (predominantly cholestatic) pattern of liver injury and ultrasound showed choledocholithiasis as well as cholecystitis.   Assessment & Plan:   Acute cholecystitis/choledocholithiasis/abnormal LFTs Patient remained stable.  LFTs are noted to be abnormal including elevated bilirubin as well as alkaline phosphatase.  Gastroenterology is following along with general surgery.  Plan is for ERCP in the next 1 to 2 days.  Patient stable.  Continue IV Zosyn.  Plan is also for possible laparoscopic cholecystectomy during this hospitalization.    Acute metabolic encephalopathy versus her usual baseline Patient's her baseline mental status is not clearly known.  Patient does have a history of bipolar disorder which could be contributing.    Bipolar disorder Patient reportedly has history of bipolar disorder and is on multiple medications for this.  Patient apparently has not been taking any of her home medications.  Patient was started back on her Depakote, olanzapine.  Fluphenazine was held.  Psychiatry consulted to assist with medication management.  May also need their assistance with assessing capacity due to her complicated psychiatric history.  HIV nonreactive.  Normocytic anemia Baseline is unknown.  Hemoglobin has been stable.  Anemia panel reviewed.  Ferritin 151.  Folate 10.6.  Vitamin B12 1440.   Prophylaxis: SCDs  Disposition: Pending resolution of choledocholithiasis  Full code   Consultants:    GI  General surgery  Procedures:   None yet  Antimicrobials:   IV Zosyn started 12/05/2019   Subjective: Patient remains tangential.  She  understands that she has stones in her gallbladder and her bile duct.  Denies any abdominal pain.  No nausea vomiting.  Objective: Vitals:   12/05/18 2005 12/06/18 0440 12/06/18 0648 12/06/18 1349  BP: (!) 122/59 129/80  (!) 139/96  Pulse: 79 70  79  Resp: 20 16  16   Temp: 98.6 F (37 C) 97.7 F (36.5 C)  97.8 F (36.6 C)  TempSrc: Oral   Oral  SpO2: 98% 100%  100%  Weight:   47.9 kg   Height:   5\' 8"  (1.727 m)     Intake/Output Summary (Last 24 hours) at 12/06/2018 1521 Last data filed at 12/06/2018 1500 Gross per 24 hour  Intake 2339.49 ml  Output -  Net 2339.49 ml   Filed Weights   12/06/18 0648  Weight: 47.9 kg    Examination:  General appearance: Awake alert.  In no distress.  Icteric.  Distracted. Resp: Clear to auscultation bilaterally.  Normal effort Cardio: S1-S2 is normal regular.  No S3-S4.  No rubs murmurs or bruit GI: Abdomen is soft.  Nontender nondistended.  Bowel sounds are present normal.  No masses organomegaly Extremities: No edema.  Full range of motion of lower extremities. Neurologic: Alert and oriented x3.  No focal neurological deficits.      Data Reviewed: I have personally reviewed following labs and imaging studies  CBC: Recent Labs  Lab 12/04/18 1523 12/05/18 0430 12/06/18 0352  WBC 10.4 11.5* 8.0  NEUTROABS 6.3  --   --   HGB 9.9* 9.1* 9.2*  HCT 29.2* 27.4* 28.0*  MCV 97.7 96.8 99.6  PLT 572* 459* 392   Basic Metabolic Panel: Recent Labs  Lab 12/04/18 1523 12/04/18 2129 12/05/18 0430 12/06/18  0352  NA 139  --  139 142  K 2.7*  --  4.0 4.2  CL 104  --  110 110  CO2 27  --  22 25  GLUCOSE 76  --  112* 119*  BUN 11  --  8 11  CREATININE 0.56  --  0.42* 0.60  CALCIUM 8.4*  --  8.3* 8.4*  MG  --  1.7  --  2.1   GFR: Estimated Creatinine Clearance: 60.1 mL/min (by C-G formula based on SCr of 0.6 mg/dL). Liver Function Tests: Recent Labs  Lab 12/04/18 1523 12/04/18 1702 12/05/18 0430 12/06/18 0352  AST 196* 190* 178*  195*  ALT 154* 150* 132* 141*  ALKPHOS 1,222* 1,223* 1,078* 1,103*  BILITOT 6.8* 6.5* 6.4* 7.0*  PROT 6.7 6.7 5.8* 5.8*  ALBUMIN 2.9* 2.8* 2.4* 2.4*   Recent Labs  Lab 12/04/18 1702 12/05/18 0430  LIPASE 86* 118*   Recent Labs  Lab 12/04/18 1702  AMMONIA 50*   Coagulation Profile: Recent Labs  Lab 12/04/18 1702  INR 0.91   Anemia Panel: Recent Labs    12/04/18 2234  VITAMINB12 1,440*  FOLATE 10.6  FERRITIN 151  TIBC 256  IRON 49     Recent Results (from the past 240 hour(s))  Blood culture (routine x 2)     Status: None (Preliminary result)   Collection Time: 12/04/18  7:50 PM  Result Value Ref Range Status   Specimen Description   Final    BLOOD RIGHT ANTECUBITAL Performed at [facility], [address] [place], [place] [address]    Special Requests   Final    BOTTLES DRAWN AEROBIC AND ANAEROBIC Blood Culture adequate volume Performed at [facility], [address] [place], [place] [address]    Culture   Final    NO GROWTH 2 DAYS Performed at [facility], [address] [place], [place] [address]    Report Status PENDING  Incomplete  Blood culture (routine x 2)     Status: None (Preliminary result)   Collection Time: 12/04/18  7:50 PM  Result Value Ref Range Status   Specimen Description   Final    BLOOD LEFT ANTECUBITAL Performed at [facility], [address] [place], [place] [address]    Special Requests   Final    BOTTLES DRAWN AEROBIC AND ANAEROBIC Blood Culture adequate volume Performed at [facility], [address] [place], [place] [address]    Culture   Final    NO GROWTH 2 DAYS Performed at [facility], [address] [place], [place] [address]    Report Status PENDING  Incomplete         Radiology Studies: Koreas Abdomen Limited Ruq  Result Date: 12/04/2018 CLINICAL DATA:  Elevated LFTs.  Confusion.  Altered mental status. EXAM:  ULTRASOUND ABDOMEN LIMITED RIGHT UPPER QUADRANT COMPARISON:  None. FINDINGS: Gallbladder: There are multiple gallstones, with sludge. Gallbladder wall thickness is increased, 3.9 mm, with suspected pericholecystic fluid. No definite sonographic Murphy's sign. Common bile duct: Diameter: Common bile duct is abnormally enlarged up to 16 mm. There is a stone within the dilated common bile duct measuring 13 mm in largest dimension. Liver: Intrahepatic biliary ductal dilatation is present. Echogenicity is increased. Portal vein is patent on color Doppler imaging with normal direction of blood flow towards the liver. IMPRESSION: Multiple gallstones with sludge. Common bile duct gallstones with intrahepatic and extrahepatic biliary ductal dilatation,  up to 16 mm CBD. Pericholecystic fluid, and gallbladder wall thickening without reported sonographic Murphy's sign. Concern raised for acute cholecystitis. These results were called by telephone at the time of interpretation on 12/04/2018 at 7:04 pm to Dr. Lynden OxfordHRISTOPHER TEGELER , who verbally acknowledged these results. Electronically Signed   By: Elsie StainJohn T Curnes M.D.   On: 12/04/2018 19:08        Scheduled Meds: . divalproex  1,000 mg Oral QHS  . OLANZapine  10 mg Oral QHS   Continuous Infusions: . 0.9 % NaCl with KCl 20 mEq / L 75 mL/hr at 12/05/18 2044  . piperacillin-tazobactam (ZOSYN)  IV 3.375 g (12/06/18 1250)     LOS: 2 days    Time spent: 6435    Osvaldo ShipperGokul Ronit Cranfield, MD [facility]  If 7PM-7AM, please contact night-coverage [email or web address] Password [email or web address] 12/06/2018, 3:21 PM

## 2018-12-06 NOTE — Consult Note (Signed)
[employer] [facility] [employer] Face-to-Face Psychiatry Consult   Reason for Consult:  Medication management  Referring Physician:  Dr. 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 female patient admitted with altered mental status.  HPI:   Per chart review, patient was admitted with altered mental status of unknown etiology. She was later found to have cholecystitis. GI would like to perform a ERCP for choledocholithiasis. She has a history of bipolar disorder and has exhibits disorganized thought process and manic symptoms. She has not been taking her medications. Home medications include Depakote 1000 mg qhs, Prolixin 5 mg qhs, Zyprexa 10 mg qhs and Melatonin 5-10 mg qhs. She was seen by TTS on 1/6 and recommended for inpatient psychiatric hospitalization. Her brother is her POA.   On interview, Kristina Knox reports that she has been unable to afford her medications so she has not been taking them.  She was previously taking Depakote, Zyprexa and Prolixin.  She speaks in detail about the damage in her apartment.  She reports that she does not have working appliances including the stove and fridge.  She currently does not have electricity or a working phone.  She reports a history of "mood swings" but denies a history of bipolar disorder.  She denies SI, HI or AVH.  She does endorse intermittent "bursts of tone."  She is unable to elaborate on what this means.  She denies any history of suicide attempts.  Past Psychiatric History: Bipolar disorder   Risk to Self: Suicidal Ideation: No Suicidal Intent: No Is patient at  risk for suicide?: No, but patient needs Medical Clearance Suicidal Plan?: No Access to Means: No What has been your use of drugs/alcohol within the last 12 months?: Denies How many times?: 0 Other Self Harm Risks: (Off medications) Triggers for Past Attempts: (NA) Intentional Self Injurious Behavior: None Risk to Others: Homicidal Ideation: No Thoughts of Harm to Others: No Current Homicidal Intent: No Current Homicidal Plan: No Access to Homicidal Means: No Identified Victim: NA History of harm to others?: No Assessment of Violence: None Noted Violent Behavior Description: NA Does patient have access to weapons?: No Criminal Charges Pending?: No Does patient have a court date: No Prior Inpatient Therapy: Prior Inpatient Therapy: Yes Prior Therapy Dates: 2000 Prior Therapy Facilty/Provider(s): [facility] Reason for Treatment: MH issues Prior Outpatient Therapy: Prior Outpatient Therapy: Yes Prior Therapy Dates: 2018 Prior Therapy Facilty/Provider(s): [facility] Reason for Treatment: Med mang Does patient have an ACCT team?: No Does patient have Intensive In-House Services?  : No Does patient have [facility] services? : No Does patient have P4CC services?: No  Past Medical History:  Past Medical History:  Diagnosis Date  . Mood disorder (HCC)   . Seasonal allergies    History reviewed. No pertinent surgical history. Family History: History reviewed. No pertinent family history. Family Psychiatric  History: Denies  Social History:  Social History   Substance and Sexual Activity  Alcohol Use No     Social History   Substance and Sexual Activity  Drug Use No    Social History   Socioeconomic History  . Marital status:  Single    Spouse name: Not on file  . Number of children: Not on file  . Years of education: Not on file  . Highest education level: Not on file  Occupational History  . Not on file  Social Needs  . Financial resource strain: Not on file  . Food  insecurity:    Worry: Not on file    Inability: Not on file  . Transportation needs:    Medical: Not on file    Non-medical: Not on file  Tobacco Use  . Smoking status: Current Every Day Smoker  . Smokeless tobacco: Never Used  Substance and Sexual Activity  . Alcohol use: No  . Drug use: No  . Sexual activity: Not Currently  Lifestyle  . Physical activity:    Days per week: Not on file    Minutes per session: Not on file  . Stress: Not on file  Relationships  . Social connections:    Talks on phone: Not on file    Gets together: Not on file    Attends religious service: Not on file    Active member of club or organization: Not on file    Attends meetings of clubs or organizations: Not on file    Relationship status: Not on file  Other Topics Concern  . Not on file  Social History Narrative  . Not on file   Additional Social History: She lives at home alone. She receives disability and her brother is her payee and POA. She denies alcohol or illicit substance use. UDS and BAL were negative on admission.     Allergies:   Allergies  Allergen Reactions  . Codeine Nausea And Vomiting    Labs:  Results for orders placed or performed during the hospital encounter of 12/04/18 (from the past 48 hour(s))  Comprehensive metabolic panel     Status: Abnormal   Collection Time: 12/04/18  3:23 PM  Result Value Ref Range   Sodium 139 135 - 145 mmol/L   Potassium 2.7 (LL) 3.5 - 5.1 mmol/L    Comment: CRITICAL RESULT CALLED TO, READ BACK BY AND VERIFIED WITH: Gena Fray [id number] @ 1619 BY J SCOTTON    Chloride 104 98 - 111 mmol/L   CO2 27 22 - 32 mmol/L   Glucose, Bld 76 70 - 99 mg/dL   BUN 11 6 - 20 mg/dL   Creatinine, Ser 0.73 0.44 - 1.00 mg/dL   Calcium 8.4 (L) 8.9 - 10.3 mg/dL   Total Protein 6.7 6.5 - 8.1 g/dL   Albumin 2.9 (L) 3.5 - 5.0 g/dL   AST 710 (H) 15 - 41 U/L   ALT 154 (H) 0 - 44 U/L   Alkaline Phosphatase 1,222 (H) 38 - 126 U/L   Total Bilirubin 6.8 (H)  0.3 - 1.2 mg/dL   GFR calc non Af Amer >60 >60 mL/min   GFR calc Af Amer >60 >60 mL/min   Anion gap 8 5 - 15    Comment: Performed at [facility], [address], [place], [place] [address]  Ethanol     Status: None   Collection Time: 12/04/18  3:23 PM  Result Value Ref Range   Alcohol, Ethyl (B) <10 <10 mg/dL    Comment: (NOTE) Lowest detectable limit for serum alcohol is 10 mg/dL. For medical purposes only. Performed at [facility], [address] [place], [place] [address]   CBC with Diff     Status: Abnormal  Collection Time: 12/04/18  3:23 PM  Result Value Ref Range   WBC 10.4 4.0 - 10.5 K/uL   RBC 2.99 (L) 3.87 - 5.11 MIL/uL   Hemoglobin 9.9 (L) 12.0 - 15.0 g/dL   HCT 16.129.2 (L) 09.636.0 - 04.546.0 %   MCV 97.7 80.0 - 100.0 fL   MCH 33.1 26.0 - 34.0 pg   MCHC 33.9 30.0 - 36.0 g/dL   RDW 40.917.1 (H) 81.111.5 - 91.415.5 %   Platelets 572 (H) 150 - 400 K/uL   nRBC 0.0 0.0 - 0.2 %   Neutrophils Relative % 60 %   Neutro Abs 6.3 1.7 - 7.7 K/uL   Lymphocytes Relative 31 %   Lymphs Abs 3.2 0.7 - 4.0 K/uL   Monocytes Relative 8 %   Monocytes Absolute 0.8 0.1 - 1.0 K/uL   Eosinophils Relative 0 %   Eosinophils Absolute 0.0 0.0 - 0.5 K/uL   Basophils Relative 0 %   Basophils Absolute 0.0 0.0 - 0.1 K/uL   Immature Granulocytes 1 %   Abs Immature Granulocytes 0.06 0.00 - 0.07 K/uL    Comment: Performed at [facility], [address] [place], [place] [address]  I-Stat beta hCG blood, ED     Status: None   Collection Time: 12/04/18  3:24 PM  Result Value Ref Range   I-stat hCG, quantitative <5.0 <5 mIU/mL   Comment 3            Comment:   GEST. AGE      CONC.  (mIU/mL)   <=1 WEEK        5 - 50     2 WEEKS       50 - 500     3 WEEKS       100 - 10,000     4 WEEKS     1,000 - 30,000        FEMALE AND NON-PREGNANT FEMALE:     LESS THAN 5 mIU/mL   Lipase, blood     Status: Abnormal   Collection Time: 12/04/18  5:02 PM  Result Value  Ref Range   Lipase 86 (H) 11 - 51 U/L    Comment: Performed at [facility], [address] [place], [place] [address]  Hepatic function panel     Status: Abnormal   Collection Time: 12/04/18  5:02 PM  Result Value Ref Range   Total Protein 6.7 6.5 - 8.1 g/dL   Albumin 2.8 (L) 3.5 - 5.0 g/dL   AST 865190 (H) 15 - 41 U/L   ALT 150 (H) 0 - 44 U/L   Alkaline Phosphatase 1,223 (H) 38 - 126 U/L   Total Bilirubin 6.5 (H) 0.3 - 1.2 mg/dL   Bilirubin, Direct 3.8 (H) 0.0 - 0.2 mg/dL   Indirect Bilirubin 2.7 (H) 0.3 - 0.9 mg/dL    Comment: Performed at [facility], [address] [place], [place] [address]  Acetaminophen level     Status: Abnormal   Collection Time: 12/04/18  5:02 PM  Result Value Ref Range   Acetaminophen (Tylenol), Serum <10 (L) 10 - 30 ug/mL    Comment: (NOTE) Therapeutic concentrations vary significantly. A range of 10-30 ug/mL  may be an effective concentration for many patients. However, some  are best treated at concentrations outside of this range. Acetaminophen concentrations >150 ug/mL at 4 hours after ingestion  and >50 ug/mL at 12 hours after ingestion are often associated with  toxic  reactions. Performed at [facility], [address], [place], [place] [address]   Salicylate level     Status: None   Collection Time: 12/04/18  5:02 PM  Result Value Ref Range   Salicylate Lvl <7.0 2.8 - 30.0 mg/dL    Comment: Performed at [facility] [address], [place], [place] [address]  Ammonia     Status: Abnormal   Collection Time: 12/04/18  5:02 PM  Result Value Ref Range   Ammonia 50 (H) 9 - 35 umol/L    Comment: Performed at [facility], [address] [place], [place] [address]  Protime-INR     Status: None   Collection Time: 12/04/18  5:02 PM  Result Value Ref Range   Prothrombin Time 12.2 11.4 - 15.2 seconds   INR 0.91     Comment: Performed at [facility], [address] [place], [place] [address]  Blood culture (routine x 2)     Status: None (Preliminary result)   Collection Time: 12/04/18  7:50 PM  Result Value Ref Range   Specimen Description      BLOOD RIGHT ANTECUBITAL Performed at [facility], [address], [place], [place] [address]    Special Requests      BOTTLES DRAWN AEROBIC AND ANAEROBIC Blood Culture adequate volume Performed at [facility], [address] [place], [place] [address]    Culture      NO GROWTH 2 DAYS Performed at [facility], [address] [place], [place] [address]    Report Status PENDING   Blood culture (routine x 2)     Status: None (Preliminary result)   Collection Time: 12/04/18  7:50 PM  Result Value Ref Range   Specimen Description      BLOOD LEFT ANTECUBITAL Performed at [facility], [address] [place], [place] [address]    Special Requests      BOTTLES DRAWN AEROBIC AND ANAEROBIC Blood Culture adequate volume Performed at [facility], [address] [place], [place] [address]    Culture      NO GROWTH 2 DAYS Performed at [facility], [address], [place], [place] [address]    Report Status PENDING   Magnesium     Status: None   Collection Time: 12/04/18  9:29 PM  Result Value Ref Range   Magnesium 1.7 1.7 - 2.4 mg/dL    Comment: Performed at [facility], [address], [place], [place] [address]  HIV antibody (Routine Testing)     Status: None   Collection Time: 12/04/18  9:29 PM  Result Value Ref Range   HIV Screen 4th Generation wRfx Non Reactive Non Reactive    Comment: (NOTE) Performed At: [facility] [address] [place], [place] [id number] Jolene Schimke MD [id number]   Iron and TIBC     Status: None   Collection Time: 12/04/18 10:34 PM  Result Value Ref Range   Iron 49 28 - 170 ug/dL   TIBC 109 323 - 557 ug/dL    Saturation Ratios 19 10.4 - 31.8 %   UIBC 207 ug/dL    Comment: Performed at [facility], [address] [place], [place] [address]  Ferritin     Status: None   Collection Time: 12/04/18 10:34 PM  Result Value Ref Range   Ferritin 151 11 - 307 ng/mL  Comment: Performed at [facility], [address] [place], [place] [address]  Vitamin B12     Status: Abnormal   Collection Time: 12/04/18 10:34 PM  Result Value Ref Range   Vitamin B-12 1,440 (H) 180 - 914 pg/mL    Comment: (NOTE) This assay is not validated for testing neonatal or myeloproliferative syndrome specimens for Vitamin B12 levels. Performed at [facility], [address], [place], [place] [address]   Folate     Status: None   Collection Time: 12/04/18 10:34 PM  Result Value Ref Range   Folate 10.6 >5.9 ng/mL    Comment: Performed at [facility], [address], [place], [place] [address]  Haptoglobin     Status: None   Collection Time: 12/04/18 10:34 PM  Result Value Ref Range   Haptoglobin 70 33 - 346 mg/dL    Comment: (NOTE)              **Please note reference interval change** Performed At: [facility] [address] [place], [place] [id number] Jolene Schimke MD KM:[id number]   Lactate dehydrogenase     Status: Abnormal   Collection Time: 12/04/18 10:34 PM  Result Value Ref Range   LDH 199 (H) 98 - 192 U/L    Comment: Performed at [facility], [address] [place], [place] [address]  Comprehensive metabolic panel     Status: Abnormal   Collection Time: 12/05/18  4:30 AM  Result Value Ref Range   Sodium 139 135 - 145 mmol/L   Potassium 4.0 3.5 - 5.1 mmol/L    Comment: DELTA CHECK NOTED REPEATED TO VERIFY NO VISIBLE HEMOLYSIS    Chloride 110 98 - 111 mmol/L   CO2 22 22 - 32 mmol/L   Glucose, Bld 112 (H) 70 - 99 mg/dL   BUN 8 6 - 20 mg/dL   Creatinine, Ser 3.33 (L) 0.44 - 1.00 mg/dL    Calcium 8.3 (L) 8.9 - 10.3 mg/dL   Total Protein 5.8 (L) 6.5 - 8.1 g/dL   Albumin 2.4 (L) 3.5 - 5.0 g/dL   AST 832 (H) 15 - 41 U/L   ALT 132 (H) 0 - 44 U/L   Alkaline Phosphatase 1,078 (H) 38 - 126 U/L   Total Bilirubin 6.4 (H) 0.3 - 1.2 mg/dL   GFR calc non Af Amer >60 >60 mL/min   GFR calc Af Amer >60 >60 mL/min   Anion gap 7 5 - 15    Comment: Performed at [facility], [address] [place], [place] [address]  CBC     Status: Abnormal   Collection Time: 12/05/18  4:30 AM  Result Value Ref Range   WBC 11.5 (H) 4.0 - 10.5 K/uL   RBC 2.83 (L) 3.87 - 5.11 MIL/uL   Hemoglobin 9.1 (L) 12.0 - 15.0 g/dL   HCT 60.6 (L) 00.4 - 59.9 %   MCV 96.8 80.0 - 100.0 fL   MCH 32.2 26.0 - 34.0 pg   MCHC 33.2 30.0 - 36.0 g/dL   RDW 77.4 (H) 14.2 - 39.5 %   Platelets 459 (H) 150 - 400 K/uL   nRBC 0.0 0.0 - 0.2 %    Comment: Performed at [facility], [address] [place], [place] [address]  Lipase, blood     Status: Abnormal   Collection Time: 12/05/18  4:30 AM  Result Value Ref Range   Lipase 118 (H) 11 - 51 U/L  Comment: Performed at [facility], [address] [place], [place] [address]  Comprehensive metabolic panel     Status: Abnormal   Collection Time: 12/06/18  3:52 AM  Result Value Ref Range   Sodium 142 135 - 145 mmol/L   Potassium 4.2 3.5 - 5.1 mmol/L   Chloride 110 98 - 111 mmol/L   CO2 25 22 - 32 mmol/L   Glucose, Bld 119 (H) 70 - 99 mg/dL   BUN 11 6 - 20 mg/dL   Creatinine, Ser 1.910.60 0.44 - 1.00 mg/dL   Calcium 8.4 (L) 8.9 - 10.3 mg/dL   Total Protein 5.8 (L) 6.5 - 8.1 g/dL   Albumin 2.4 (L) 3.5 - 5.0 g/dL   AST 478195 (H) 15 - 41 U/L   ALT 141 (H) 0 - 44 U/L   Alkaline Phosphatase 1,103 (H) 38 - 126 U/L   Total Bilirubin 7.0 (H) 0.3 - 1.2 mg/dL   GFR calc non Af Amer >60 >60 mL/min   GFR calc Af Amer >60 >60 mL/min   Anion gap 7 5 - 15    Comment: Performed at [facility], [address] [place], [place] [address]  CBC     Status: Abnormal   Collection Time: 12/06/18  3:52 AM  Result Value Ref Range   WBC 8.0 4.0 - 10.5 K/uL   RBC 2.81 (L) 3.87 - 5.11 MIL/uL   Hemoglobin 9.2 (L) 12.0 - 15.0 g/dL   HCT 13.028.0 (L) 86.536.0 - 78.446.0 %   MCV 99.6 80.0 - 100.0 fL   MCH 32.7 26.0 - 34.0 pg   MCHC 32.9 30.0 - 36.0 g/dL   RDW 69.617.3 (H) 29.511.5 - 28.415.5 %   Platelets 392 150 - 400 K/uL   nRBC 0.0 0.0 - 0.2 %    Comment: Performed at [facility], [address] [place], [place] [address]  Magnesium     Status: None   Collection Time: 12/06/18  3:52 AM  Result Value Ref Range   Magnesium 2.1 1.7 - 2.4 mg/dL    Comment: Performed at [facility], [address] [place], [place] [address]    Current Facility-Administered Medications  Medication Dose Route Frequency Provider Last Rate Last Dose  . 0.9 % NaCl with KCl 20 mEq/ L  infusion   Intravenous Continuous Johnson-Pitts, Endia, MD 75 mL/hr at 12/05/18 2044    . acetaminophen (TYLENOL) tablet 650 mg  650 mg Oral Q6H PRN Johnson-Pitts, Endia, MD       Or  . acetaminophen (TYLENOL) suppository 650 mg  650 mg Rectal Q6H PRN Johnson-Pitts, Endia, MD      . divalproex (DEPAKOTE) DR tablet 1,000 mg  1,000 mg Oral QHS Purohit, Shrey C, MD   1,000 mg at 12/05/18 2205  . OLANZapine (ZYPREXA) tablet 10 mg  10 mg Oral QHS Purohit, Shrey C, MD   10 mg at 12/05/18 2205  . ondansetron (ZOFRAN) tablet 4 mg  4 mg Oral Q6H PRN Johnson-Pitts, Endia, MD       Or  . ondansetron (ZOFRAN) injection 4 mg  4 mg Intravenous Q6H PRN Johnson-Pitts, Endia, MD      . piperacillin-tazobactam (ZOSYN) IVPB 3.375 g  3.375 g Intravenous Q8H Johnson-Pitts, Endia, MD 12.5 mL/hr at 12/06/18 0600    . polyethylene glycol (MIRALAX / GLYCOLAX) packet 17 g  17 g Oral Daily PRN Johnson-Pitts, Endia, MD        Musculoskeletal: Strength &  Muscle Tone: within normal limits Gait & Station: UTA since lying in bed. Patient leans:  N/A  Psychiatric Specialty Exam: Physical Exam  Nursing note and vitals reviewed. Constitutional: She is oriented to person, place, and time. She appears well-developed and well-nourished.  HENT:  Head: Normocephalic and atraumatic.  Neck: Normal range of motion.  Respiratory: Effort normal.  Musculoskeletal: Normal range of motion.  Neurological: She is alert and oriented to person, place, and time.  Psychiatric: She has a normal mood and affect. Her behavior is normal. Judgment and thought content normal. Her speech is tangential. Cognition and memory are normal.    Review of Systems  Cardiovascular: Negative for chest pain.  Gastrointestinal: Negative for abdominal pain, constipation, diarrhea, nausea and vomiting.  Psychiatric/Behavioral: Negative for hallucinations, substance abuse and suicidal ideas.  All other systems reviewed and are negative.   Blood pressure 129/80, pulse 70, temperature 97.7 F (36.5 C), resp. rate 16, height 5\' 8"  (1.727 m), weight 47.9 kg, SpO2 100 %.Body mass index is 16.06 kg/m.  General Appearance: Fairly Groomed, middle aged, Caucasian female, wearing a hospital gown with long, curly hair and jaundiced skin who is lying in bed. NAD.   Eye Contact:  Good  Speech:  Clear and Coherent and Normal Rate  Volume:  Normal  Mood:  Euthymic  Affect:  Appropriate  Thought Process:  Linear and Descriptions of Associations: Intact but tangential when elaborating on information at times.   Orientation:  Full (Time, Place, and Person)  Thought Content:  Logical  Suicidal Thoughts:  No  Homicidal Thoughts:  No  Memory:  Immediate;   Good Recent;   Good Remote;   Good  Judgement:  Fair  Insight:  Fair  Psychomotor Activity:  Normal  Concentration:  Concentration: Good and Attention Span: Good  Recall:  Good  Fund of Knowledge:  Good  Language:  Good  Akathisia:  No  Handed:  Right  AIMS (if indicated):   N/A  Assets:  Financial  Resources/Insurance Housing Social Support  ADL's:  Intact  Cognition:  WNL  Sleep:   N/A   Assessment: Kristina Knox is a 58 y.o. female who was admitted with AMS. She is oriented to person, place and time. She is mildly tangential in thought process but does not exhibit agitation and is not responding to internal stimuli. She denies SI, HI or AVH. Recommend increasing Zyprexa for mood stabilization.   Treatment Plan Summary: -Continue Depakote 1000 mg qhs for mood stabilization.  -Increase Zyprexa 10 mg qhs to 5 mg q am and 10 mg qhs for mood stabilization.  -EKG reviewed and QTc 436 on 1/6. Please closely monitor when starting or increasing QTc prolonging agents.  -Please have SW provide patient with outpatient mental health resources for medication management.  -Will follow patient as needed.    Disposition: No evidence of imminent risk to self or others at present.    Cherly Beach, DO 12/06/2018 12:11 PM

## 2018-12-06 NOTE — Progress Notes (Signed)
Patient ID: Kristina Knox, female   DOB: 11-09-1961, 58 y.o.   MRN: [id number]       Subjective: Patient sleeping.  Awoke and seems a bit scattered in her thoughts and speech.  She seems to deny abdominal pain.  Objective: Vital signs in last 24 hours: Temp:  [97.7 F (36.5 C)-98.6 F (37 C)] 97.7 F (36.5 C) (01/08 0440) Pulse Rate:  [70-79] 70 (01/08 0440) Resp:  [16-20] 16 (01/08 0440) BP: (122-132)/(59-80) 129/80 (01/08 0440) SpO2:  [98 %-100 %] 100 % (01/08 0440) Weight:  [47.9 kg] 47.9 kg (01/08 0648) Last BM Date: 12/04/18  Intake/Output from previous day: 01/07 0701 - 01/08 0700 In: 1565.4 [P.O.:480; I.V.:931.1; IV Piggyback:154.2] Out: -  Intake/Output this shift: No intake/output data recorded.  PE: Skin: jaundice Heart: regular Lungs: CTAB Abd: soft, but does tense up when pressing on her RUQ leading me to believe she is tender in that area, +BS, ND  Lab Results:  Recent Labs    12/05/18 0430 12/06/18 0352  WBC 11.5* 8.0  HGB 9.1* 9.2*  HCT 27.4* 28.0*  PLT 459* 392   BMET Recent Labs    12/05/18 0430 12/06/18 0352  NA 139 142  K 4.0 4.2  CL 110 110  CO2 22 25  GLUCOSE 112* 119*  BUN 8 11  CREATININE 0.42* 0.60  CALCIUM 8.3* 8.4*   PT/INR Recent Labs    12/04/18 1702  LABPROT 12.2  INR 0.91   CMP     Component Value Date/Time   NA 142 12/06/2018 0352   K 4.2 12/06/2018 0352   CL 110 12/06/2018 0352   CO2 25 12/06/2018 0352   GLUCOSE 119 (H) 12/06/2018 0352   BUN 11 12/06/2018 0352   CREATININE 0.60 12/06/2018 0352   CALCIUM 8.4 (L) 12/06/2018 0352   PROT 5.8 (L) 12/06/2018 0352   ALBUMIN 2.4 (L) 12/06/2018 0352   AST 195 (H) 12/06/2018 0352   ALT 141 (H) 12/06/2018 0352   ALKPHOS 1,103 (H) 12/06/2018 0352   BILITOT 7.0 (H) 12/06/2018 0352   GFRNONAA >60 12/06/2018 0352   GFRAA >60 12/06/2018 0352   Lipase     Component Value Date/Time   LIPASE 118 (H) 12/05/2018 0430       Studies/Results: Dg Chest 2  View  Result Date: 12/04/2018 CLINICAL DATA:  Medical clearance EXAM: CHEST - 2 VIEW COMPARISON:  None. FINDINGS: Lungs are clear. The heart size and pulmonary vascularity are normal. No adenopathy. There is midthoracic levoscoliosis. IMPRESSION: No edema or consolidation. Electronically Signed   By: Bretta Bang III M.D.   On: 12/04/2018 14:35   US Abdomen Limited Ruq  Result Date: 12/04/2018 CLINICAL DATA:  Elevated LFTs.  Confusion.  Altered mental status. EXAM: ULTRASOUND ABDOMEN LIMITED RIGHT UPPER QUADRANT COMPARISON:  None. FINDINGS: Gallbladder: There are multiple gallstones, with sludge. Gallbladder wall thickness is increased, 3.9 mm, with suspected pericholecystic fluid. No definite sonographic Murphy's sign. Common bile duct: Diameter: Common bile duct is abnormally enlarged up to 16 mm. There is a stone within the dilated common bile duct measuring 13 mm in largest dimension. Liver: Intrahepatic biliary ductal dilatation is present. Echogenicity is increased. Portal vein is patent on color Doppler imaging with normal direction of blood flow towards the liver. IMPRESSION: Multiple gallstones with sludge. Common bile duct gallstones with intrahepatic and extrahepatic biliary ductal dilatation, up to 16 mm CBD. Pericholecystic fluid, and gallbladder wall thickening without reported sonographic Murphy's sign. Concern raised for acute cholecystitis. These  results were called by telephone at the time of interpretation on 12/04/2018 at 7:04 pm to Dr. Lynden Oxford , who verbally acknowledged these results. Electronically Signed   By: Elsie Stain M.D.   On: 12/04/2018 19:08    Anti-infectives: Anti-infectives (From admission, onward)   Start     Dose/Rate Route Frequency Ordered Stop   12/05/18 0200  piperacillin-tazobactam (ZOSYN) IVPB 3.375 g     3.375 g 12.5 mL/hr over 240 Minutes Intravenous Every 8 hours 12/04/18 2129     12/04/18 2200  piperacillin-tazobactam (ZOSYN) IVPB 3.375 g   Status:  Discontinued     3.375 g 100 mL/hr over 30 Minutes Intravenous Every 8 hours 12/04/18 2116 12/04/18 2129   12/04/18 1945  piperacillin-tazobactam (ZOSYN) IVPB 3.375 g     3.375 g 100 mL/hr over 30 Minutes Intravenous  Once 12/04/18 1936 12/04/18 2019       Assessment/Plan Choledocholithiasis -ERCP by GI when competency determined and can get consent -Will likely require cholecystectomy after ERCP -Continue abx per primary service -cont to follow  Altered mental status -Competency  pending psych eval -Unclear etiology  FEN - heart healthy diet VTE - SCD, ok for chemical prophylaxis from our standpoint ID - Zosyn    LOS: 2 days    Letha Cape , [facility] 12/06/2018, 9:42 AM Pager: [phone]

## 2018-12-07 DIAGNOSIS — F39 Unspecified mood [affective] disorder: Secondary | ICD-10-CM

## 2018-12-07 LAB — COMPREHENSIVE METABOLIC PANEL
ALT: 130 U/L — ABNORMAL HIGH (ref 0–44)
AST: 149 U/L — ABNORMAL HIGH (ref 15–41)
Albumin: 2.4 g/dL — ABNORMAL LOW (ref 3.5–5.0)
Alkaline Phosphatase: 1046 U/L — ABNORMAL HIGH (ref 38–126)
Anion gap: 9 (ref 5–15)
BUN: 10 mg/dL (ref 6–20)
CO2: 25 mmol/L (ref 22–32)
Calcium: 8.6 mg/dL — ABNORMAL LOW (ref 8.9–10.3)
Chloride: 106 mmol/L (ref 98–111)
Creatinine, Ser: 0.54 mg/dL (ref 0.44–1.00)
GFR calc Af Amer: >60 mL/min (ref >60)
Glucose, Bld: 97 mg/dL (ref 70–99)
Potassium: 4.2 mmol/L (ref 3.5–5.1)
Sodium: 140 mmol/L (ref 135–145)
Total Bilirubin: 5.4 mg/dL — ABNORMAL HIGH (ref 0.3–1.2)
Total Protein: 5.9 g/dL — ABNORMAL LOW (ref 6.5–8.1)

## 2018-12-07 LAB — CBC
HCT: 28.4 % — ABNORMAL LOW (ref 36.0–46.0)
Hemoglobin: 9.5 g/dL — ABNORMAL LOW (ref 12.0–15.0)
MCH: 32.8 pg (ref 26.0–34.0)
MCHC: 33.5 g/dL (ref 30.0–36.0)
MCV: 97.9 fL (ref 80.0–100.0)
Platelets: 369 10*3/uL (ref 150–400)
RBC: 2.9 MIL/uL — ABNORMAL LOW (ref 3.87–5.11)
RDW: 17.3 % — ABNORMAL HIGH (ref 11.5–15.5)
WBC: 7.1 10*3/uL (ref 4.0–10.5)
nRBC: 0 % (ref 0.0–0.2)

## 2018-12-07 MED ORDER — FLUPHENAZINE HCL 5 MG PO TABS
5.0000 mg | ORAL_TABLET | Freq: Every day | ORAL | Status: DC
  Administered 2018-12-07 – 2018-12-12 (×6): 5 mg via ORAL
  Filled 2018-12-07 (×6): qty 1

## 2018-12-07 NOTE — Progress Notes (Signed)
Continuing to follow.  Awaiting ERCP to plan lap chole to follow. No other new recommendations at this time.  Letha Cape 7:15 AM 12/07/2018

## 2018-12-07 NOTE — Progress Notes (Signed)
PROGRESS NOTE    Kristina Knox  PQD:[id number] DOB: 1961-06-18 DOA: 12/04/2018 PCP: Darrow Bussing, MD    Brief Narrative:  58 year old with past medical history relevant for bipolar disorder brought into the emergency department for altered mental status.  Psychiatric screening labs found mixed cholestatic and hepatocellular (predominantly cholestatic) pattern of liver injury and ultrasound showed choledocholithiasis as well as cholecystitis.  Patient was hospitalized.  Gastroenterology was consulted.   Assessment & Plan:   Acute cholecystitis/choledocholithiasis/abnormal LFTs Remains stable.  She is noted to be jaundiced.  LFTs remain elevated.  Continue Zosyn for now.  Gastroenterology is following.  Plan is for ERCP tomorrow.  Plan is also for laparoscopic cholecystectomy during this hospitalization.  General surgery is also following.   Hepatitis panel is pending.  Acute metabolic encephalopathy versus her usual baseline Does not appear to be encephalopathic.  Her mentation could be baseline due to her psychiatric history.  Continue to monitor.     Bipolar disorder Patient reportedly has history of bipolar disorder and is on multiple medications for this.  Patient apparently has not been taking any of her home medications.  Patient was started back on her Depakote, olanzapine.  Psychiatry has seen the patient.  Dose of olanzapine added during the daytime as well.  Fluphenazine was held.  HIV nonreactive.  Normocytic anemia Baseline is unknown.  Hemoglobin has been stable.  Anemia panel reviewed.  Ferritin 151.  Folate 10.6.  Vitamin B12 1440.   Prophylaxis: SCDs  Disposition: Pending resolution of choledocholithiasis.  Her home situation is not clear.  Social worker to assist.  Full code   Consultants:   [facility]  General surgery  Procedures:   None yet  Antimicrobials:   IV Zosyn started 12/05/2019   Subjective: Patient denies any abdominal pain nausea or  vomiting.  She remains tangential.  Objective: Vitals:   12/06/18 0648 12/06/18 1349 12/06/18 2142 12/07/18 0415  BP:  (!) 139/96 115/79 134/74  Pulse:  79 72 62  Resp:  16 16 18   Temp:  97.8 F (36.6 C) 98.2 F (36.8 C) 97.9 F (36.6 C)  TempSrc:  Oral Oral Oral  SpO2:  100% 100% 100%  Weight: 47.9 kg     Height: 5\' 8"  (1.727 m)       Intake/Output Summary (Last 24 hours) at 12/07/2018 1217 Last data filed at 12/07/2018 0000 Gross per 24 hour  Intake 1629.42 ml  Output -  Net 1629.42 ml   Filed Weights   12/06/18 0648  Weight: 47.9 kg    Examination: General appearance: Awake alert.  In no distress.  Remains icteric.  Remains distracted. Resp: Clear to auscultation bilaterally.  Normal effort Cardio: S1-S2 is normal regular.  No S3-S4.  No rubs murmurs or bruit GI: Abdomen is soft.  Nontender nondistended.  Bowel sounds are present normal.  No masses organomegaly Extremities: No edema.  Full range of motion of lower extremities. Neurologic: Alert and oriented x3.  No focal neurological deficits.      Data Reviewed: I have personally reviewed following labs and imaging studies  CBC: Recent Labs  Lab 12/04/18 1523 12/05/18 0430 12/06/18 0352 12/07/18 0406  WBC 10.4 11.5* 8.0 7.1  NEUTROABS 6.3  --   --   --   HGB 9.9* 9.1* 9.2* 9.5*  HCT 29.2* 27.4* 28.0* 28.4*  MCV 97.7 96.8 99.6 97.9  PLT 572* 459* 392 369   Basic Metabolic Panel: Recent Labs  Lab 12/04/18 1523 12/04/18 2129 12/05/18 0430  12/06/18 0352 12/07/18 0406  NA 139  --  139 142 140  K 2.7*  --  4.0 4.2 4.2  CL 104  --  110 110 106  CO2 27  --  22 25 25   GLUCOSE 76  --  112* 119* 97  BUN 11  --  8 11 10   CREATININE 0.56  --  0.42* 0.60 0.54  CALCIUM 8.4*  --  8.3* 8.4* 8.6*  MG  --  1.7  --  2.1  --    GFR: Estimated Creatinine Clearance: 60.1 mL/min (by C-G formula based on SCr of 0.54 mg/dL). Liver Function Tests: Recent Labs  Lab 12/04/18 1523 12/04/18 1702 12/05/18 0430  12/06/18 0352 12/07/18 0406  AST 196* 190* 178* 195* 149*  ALT 154* 150* 132* 141* 130*  ALKPHOS 1,222* 1,223* 1,078* 1,103* 1,046*  BILITOT 6.8* 6.5* 6.4* 7.0* 5.4*  PROT 6.7 6.7 5.8* 5.8* 5.9*  ALBUMIN 2.9* 2.8* 2.4* 2.4* 2.4*   Recent Labs  Lab 12/04/18 1702 12/05/18 0430  LIPASE 86* 118*   Recent Labs  Lab 12/04/18 1702  AMMONIA 50*   Coagulation Profile: Recent Labs  Lab 12/04/18 1702  INR 0.91   Anemia Panel: Recent Labs    12/04/18 2234  VITAMINB12 1,440*  FOLATE 10.6  FERRITIN 151  TIBC 256  IRON 49     Recent Results (from the past 240 hour(s))  Blood culture (routine x 2)     Status: None (Preliminary result)   Collection Time: 12/04/18  7:50 PM  Result Value Ref Range Status   Specimen Description   Final    BLOOD RIGHT ANTECUBITAL Performed at [facility], [address] [place], [place] [address]    Special Requests   Final    BOTTLES DRAWN AEROBIC AND ANAEROBIC Blood Culture adequate volume Performed at [facility], [address] [place], [place] [address]    Culture   Final    NO GROWTH 3 DAYS Performed at [facility], [address], [place], [place] [address]    Report Status PENDING  Incomplete  Blood culture (routine x 2)     Status: None (Preliminary result)   Collection Time: 12/04/18  7:50 PM  Result Value Ref Range Status   Specimen Description   Final    BLOOD LEFT ANTECUBITAL Performed at [facility], [address], [place], [place] [address]    Special Requests   Final    BOTTLES DRAWN AEROBIC AND ANAEROBIC Blood Culture adequate volume Performed at [facility], [address] [place], [place] [address]    Culture   Final    NO GROWTH 3 DAYS Performed at [facility], [address], [place], [place] [address]    Report Status PENDING  Incomplete         Radiology Studies: No results found.   Scheduled Meds: .  divalproex  1,000 mg Oral QHS  . OLANZapine  10 mg Oral QHS  . OLANZapine  5 mg Oral Daily   Continuous Infusions: . 0.9 % NaCl with KCl 20 mEq / L Stopped (12/06/18 2032)  . piperacillin-tazobactam (ZOSYN)  IV 3.375 g (12/07/18 0453)     LOS: 3 days    Osvaldo Shipper, MD [facility]  If 7PM-7AM, please contact night-coverage [email or web address] Password [facility] 12/07/2018, 12:17 PM

## 2018-12-07 NOTE — Evaluation (Signed)
Occupational Therapy Evaluation Patient Details Name: Kristina Knox MRN: [id number] DOB: 03/04/1961 Today's Date: 12/07/2018    History of Present Illness 58 y.o. female with PMH of bipolar Knox/o admitted with bizarre behavior. Per psych notes, pt's brother found pt living in her apartment with human feces on floor, and pt was having paranoid behavior.    Clinical Impression   Pt overall I with simple ADL activity in her room.  Depending on psych / cognition would A at DC.      Follow Up Recommendations  No OT follow up;Supervision/Assistance - 24 hour(depending on Psych/cognitive issues)    Equipment Recommendations  None recommended by OT    Recommendations for Other Services       Precautions / Restrictions Precautions Precautions: Fall      Mobility Bed Mobility Overal bed mobility: Independent                Transfers Overall transfer level: Independent                    Balance Overall balance assessment: Independent                                         ADL either performed or assessed with clinical judgement   ADL Overall ADL's : Independent                                       General ADL Comments: Pt overall I with simple ADL activity in her room.  Pt able to go to bathroom, don socks, open drawers, open closet at I level     Vision Patient Visual Report: No change from baseline              Pertinent Vitals/Pain Pain Assessment: No/denies pain        Extremity/Trunk Assessment         Cervical / Trunk Assessment Cervical / Trunk Assessment: Normal   Communication Communication Communication: No difficulties   Cognition Arousal/Alertness: Awake/alert Behavior During Therapy: WFL for tasks assessed/performed;Anxious Overall Cognitive Status: No family/caregiver present to determine baseline cognitive functioning              Pt aware of date, place and situation this day but did  seem anxious about making sure she has her belongings.                     General Comments: pt did seem anxious about where her belongings are as well as getting to bathroom in time.  OT Aed pt to bathroom and helped pt find belongings inher room              Home Living Family/patient expects to be discharged to:: Private residence Living Arrangements: Alone   Type of Home: Apartment Home Access: Level entry     Home Layout: One level               Home Equipment: None          Prior Functioning/Environment Level of Independence: Independent        Comments: independent with mobility, per psych note pt was found in home with feces on floor so wasn't caring for herself well  OT Goals(Current goals can be found in the care plan section) Acute Rehab OT Goals Patient Stated Goal: make sure she has all belongings OT Goal Formulation: With patient Time For Goal Achievement: 12/07/18  OT Frequency:      AM-PAC OT "6 Clicks" Daily Activity     Outcome Measure Help from another person eating meals?: None Help from another person taking care of personal grooming?: None Help from another person toileting, which includes using toliet, bedpan, or urinal?: None Help from another person bathing (including washing, rinsing, drying)?: None Help from another person to put on and taking off regular upper body clothing?: None Help from another person to put on and taking off regular lower body clothing?: None 6 Click Score: 24   End of Session Nurse Communication: Mobility status  Activity Tolerance: Patient tolerated treatment well Patient left: in bed;with bed alarm set                   Time: 1059-1111 OT Time Calculation (min): 12 min Charges:  OT General Charges $OT Visit: 1 Visit OT Evaluation $OT Eval Low Complexity: 1 Low  Kristina Knox, OT Acute Rehabilitation Services [phone] Office- [phone]     Kristina Knox,  Kristina Knox 12/07/2018, 12:46 PM

## 2018-12-07 NOTE — Consult Note (Signed)
[facility]  12/07/2018 10:20 AM Kristina Bondsmily S Knox  MRN:  [id number] Subjective:   Kristina Knox was last seen yesterday for medication management. Zyprexa was increased for mood stabilization. Psychiatry is reconsulted for capacity evaluation to consent to ERCP procedure.   On interview, Kristina Knox reports that she is doing well. She denies problems with sleep or appetite. She denies SI, HI or AVH. She is able to state the indication for ERCP but she does not have a full understanding of the risks and benefits of this procedure. She reports that she could have internal bleeding but "it isn't a big deal." She reports that "the blood will pass when using the restroom." She was unable to comprehend the seriousness of complications from the procedure even after they were explained to her.    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 Mood disorder (HCC)   . Seasonal allergies    History reviewed. No pertinent surgical history. Family History: History reviewed. No pertinent family history. Family Psychiatric  History: Denies  Social History:  Social History   Substance and Sexual Activity  Alcohol Use No     Social History   Substance and Sexual Activity  Drug Use No    Social History   Socioeconomic History  . Marital status: Single    Spouse name: Not on file  . Number of children: Not on file  . Years of education: Not on file  . Highest education level: Not on file  Occupational History  . Not on file  Social Needs  . Financial resource strain: Not on file   . Food insecurity:    Worry: Not on file    Inability: Not on file  . Transportation needs:    Medical: Not on file    Non-medical: Not on file  Tobacco Use  . Smoking status: Current Every Day Smoker  . Smokeless tobacco: Never Used  Substance and Sexual Activity  . Alcohol use: No  . Drug use: No  . Sexual activity: Not Currently  Lifestyle  . Physical activity:    Days per week: Not on file    Minutes per session: Not on file  . Stress: Not on file  Relationships  . Social connections:    Talks on phone: Not on file    Gets together: Not on file    Attends religious service: Not on file    Active member of club or organization: Not on file    Attends meetings of clubs or organizations: Not on file    Relationship status: Not on file  Other Topics Concern  . Not on file  Social History Narrative  . Not on file    Sleep: Good  Appetite:  Good  Current Medications: Current Facility-Administered Medications  Medication Dose Route Frequency Provider Last Rate Last Dose  . 0.9 % NaCl with KCl 20 mEq/ L  infusion   Intravenous Continuous Johnson-Pitts, Endia, MD   Stopped at 12/06/18 2032  . acetaminophen (TYLENOL) tablet 650 mg  650 mg Oral Q6H PRN Johnson-Pitts, Endia, MD  Or  . acetaminophen (TYLENOL) suppository 650 mg  650 mg Rectal Q6H PRN Johnson-Pitts, Endia, MD      . divalproex (DEPAKOTE) DR tablet 1,000 mg  1,000 mg Oral QHS Purohit, Shrey C, MD   1,000 mg at 12/06/18 2030  . OLANZapine (ZYPREXA) tablet 10 mg  10 mg Oral QHS Purohit, Shrey C, MD   10 mg at 12/06/18 2030  . OLANZapine (ZYPREXA) tablet 5 mg  5 mg Oral Daily Juanetta Beets J, DO      . ondansetron [facility]) tablet 4 mg  4 mg Oral Q6H PRN Johnson-Pitts, Endia, MD       Or  . ondansetron (ZOFRAN) injection 4 mg  4 mg Intravenous Q6H PRN Johnson-Pitts, Endia, MD      . piperacillin-tazobactam (ZOSYN) IVPB 3.375 g  3.375 g Intravenous Q8H Johnson-Pitts, Endia, MD 12.5 mL/hr at 12/07/18 0453  3.375 g at 12/07/18 0453  . polyethylene glycol (MIRALAX / GLYCOLAX) packet 17 g  17 g Oral Daily PRN Johnson-Pitts, Endia, MD        Lab Results:  Results for orders placed or performed during the hospital encounter of 12/04/18 (from the past 48 hour(s))  Comprehensive metabolic panel     Status: Abnormal   Collection Time: 12/06/18  3:52 AM  Result Value Ref Range   Sodium 142 135 - 145 mmol/L   Potassium 4.2 3.5 - 5.1 mmol/L   Chloride 110 98 - 111 mmol/L   CO2 25 22 - 32 mmol/L   Glucose, Bld 119 (H) 70 - 99 mg/dL   BUN 11 6 - 20 mg/dL   Creatinine, Ser 1.61 0.44 - 1.00 mg/dL   Calcium 8.4 (L) 8.9 - 10.3 mg/dL   Total Protein 5.8 (L) 6.5 - 8.1 g/dL   Albumin 2.4 (L) 3.5 - 5.0 g/dL   AST 096 (H) 15 - 41 U/L   ALT 141 (H) 0 - 44 U/L   Alkaline Phosphatase 1,103 (H) 38 - 126 U/L   Total Bilirubin 7.0 (H) 0.3 - 1.2 mg/dL   GFR calc non Af Amer >60 >60 mL/min   GFR calc Af Amer >60 >60 mL/min   Anion gap 7 5 - 15    Comment: Performed at [facility], [address] [place], [place] [address]  CBC     Status: Abnormal   Collection Time: 12/06/18  3:52 AM  Result Value Ref Range   WBC 8.0 4.0 - 10.5 K/uL   RBC 2.81 (L) 3.87 - 5.11 MIL/uL   Hemoglobin 9.2 (L) 12.0 - 15.0 g/dL   HCT 98.1 (L) 19.1 - 47.8 %   MCV 99.6 80.0 - 100.0 fL   MCH 32.7 26.0 - 34.0 pg   MCHC 32.9 30.0 - 36.0 g/dL   RDW 29.5 (H) 62.1 - 30.8 %   Platelets 392 150 - 400 K/uL   nRBC 0.0 0.0 - 0.2 %    Comment: Performed at [facility], [address], [place], [place] [address]  Magnesium     Status: None   Collection Time: 12/06/18  3:52 AM  Result Value Ref Range   Magnesium 2.1 1.7 - 2.4 mg/dL    Comment: Performed at [facility], [address] [place], [place] [address]  CBC     Status: Abnormal   Collection Time: 12/07/18  4:06 AM  Result Value Ref Range   WBC 7.1 4.0 - 10.5 K/uL   RBC 2.90 (L) 3.87 - 5.11  MIL/uL   Hemoglobin 9.5 (L)  12.0 - 15.0 g/dL   HCT 16.1 (L) 09.6 - 04.5 %   MCV 97.9 80.0 - 100.0 fL   MCH 32.8 26.0 - 34.0 pg   MCHC 33.5 30.0 - 36.0 g/dL   RDW 40.9 (H) 81.1 - 91.4 %   Platelets 369 150 - 400 K/uL   nRBC 0.0 0.0 - 0.2 %    Comment: Performed at [facility], [address] [place], [place] [address]  Comprehensive metabolic panel     Status: Abnormal   Collection Time: 12/07/18  4:06 AM  Result Value Ref Range   Sodium 140 135 - 145 mmol/L   Potassium 4.2 3.5 - 5.1 mmol/L   Chloride 106 98 - 111 mmol/L   CO2 25 22 - 32 mmol/L   Glucose, Bld 97 70 - 99 mg/dL   BUN 10 6 - 20 mg/dL   Creatinine, Ser 6.21 0.44 - 1.00 mg/dL   Calcium 8.6 (L) 8.9 - 10.3 mg/dL   Total Protein 5.9 (L) 6.5 - 8.1 g/dL   Albumin 2.4 (L) 3.5 - 5.0 g/dL   AST 308 (H) 15 - 41 U/L   ALT 130 (H) 0 - 44 U/L   Alkaline Phosphatase 1,046 (H) 38 - 126 U/L   Total Bilirubin 5.4 (H) 0.3 - 1.2 mg/dL   GFR calc non Af Amer >60 >60 mL/min   GFR calc Af Amer >60 >60 mL/min   Anion gap 9 5 - 15    Comment: Performed at [facility], [address], [place], [place] [address]    Blood Alcohol level:  Lab Results  Component Value Date   [facility] <10 12/04/2018     Musculoskeletal: Strength & Muscle Tone: within normal limits Gait & Station: UTA since patient is lying in bed. Patient leans: N/A  Psychiatric Specialty Exam: Physical Exam  Nursing note and vitals reviewed. Constitutional: She is oriented to person, place, and time. She appears well-developed and well-nourished.  HENT:  Head: Normocephalic and atraumatic.  Neck: Normal range of motion.  Respiratory: Effort normal.  Musculoskeletal: Normal range of motion.  Neurological: She is alert and oriented to person, place, and time.  Psychiatric: She has a normal mood and affect. Her speech is normal and behavior is normal. Judgment and thought content normal. Cognition and memory are normal.    Review of Systems   Cardiovascular: Negative for chest pain.  Gastrointestinal: Negative for abdominal pain, constipation, diarrhea, nausea and vomiting.  Psychiatric/Behavioral: Negative for hallucinations and suicidal ideas. The patient does not have insomnia.   All other systems reviewed and are negative.   Blood pressure 134/74, pulse 62, temperature 97.9 F (36.6 C), temperature source Oral, resp. rate 18, height 5\' 8"  (1.727 m), weight 47.9 kg, SpO2 100 %.Body mass index is 16.06 kg/m.  General Appearance: Fairly Groomed, middle aged, Caucasian female, wearing a hospital gown with long, curly hair and jaundiced skin who is lying in bed. NAD.   Eye Contact:  Good  Speech:  Clear and Coherent and Normal Rate  Volume:  Normal  Mood:  Euthymic  Affect:  Appropriate and Congruent  Thought Process:  Goal Directed, Linear and Descriptions of Associations: Intact  Orientation:  Full (Time, Place, and Person)  Thought Content:  Logical  Suicidal Thoughts:  No  Homicidal Thoughts:  No  Memory:  Immediate;   Good Recent;   Good Remote;   Good  Judgement:  Poor  Insight:  Shallow  Psychomotor Activity:  Normal  Concentration:  Concentration: Good and Attention Span: Good  Recall:  Good  Fund of Knowledge:  Good  Language:  Good  Akathisia:  No  Handed:  Right  AIMS (if indicated):   N/A  Assets:  Communication Skills Desire for Improvement Financial Resources/Insurance Housing Social Support  ADL's:  Intact  Cognition:  WNL  Sleep:   Okay    Assessment:  Kristina Bondsmily S Knox is a 58 y.o. female who was admitted with AMS and later found to have choledocholithiasis. She is more organized in thought process today. She requests that Prolixin be restarted for mood stabilization. She lacks capacity to consent to ERCP procedure because she lacks understanding of the risks and benefits of this procedure. She will need an authorized representative to consent. She appears stabilized on her medications and does  not warrant inpatient psychiatric hospitalization at this time.   Treatment Plan Summary: -Continue Depakote 1000 mg qhs for mood stabilization.  -Decrease Zyprexa 5 mg q am and 10 mg qhs to 10 mg qhs for mood stabilization.  -Resume Prolixin 5 mg qhs for mood stabilization.  -EKG reviewed and QTc 436 on 1/6. Please closely monitor when starting or increasing QTc prolonging agents.  -Patient lacks capacity to consent to ERCP procedure.  -Psychiatry will sign off on patient at this time. Please consult psychiatry again as needed.   Cherly BeachJacqueline J Georgena Weisheit, DO 12/07/2018, 10:20 AM

## 2018-12-07 NOTE — H&P (View-Only) (Signed)
[facility] Attending   I have taken an interval history, reviewed the chart and examined the patient. I agree with the Advanced Practitioner's note, impression and recommendations.   Trying to get ERCP and stone extraction set up for tomorrow.  I have explained it to her including risks, benefits and alternatives (no good ones here)  Ziyon Cedotal E. Siani Utke, MD, FACG [facility] 12/07/2018 11:23 AM Pager [phone]     Gastroenterology Progress Note  CC:  CBD stones/dilation  Subjective:  Denies abdominal pain.  LFT's overall unchanged.    Objective:  Vital signs in last 24 hours: Temp:  [97.8 F (36.6 C)-98.2 F (36.8 C)] 97.9 F (36.6 C) (01/09 0415) Pulse Rate:  [62-79] 62 (01/09 0415) Resp:  [16-18] 18 (01/09 0415) BP: (115-139)/(74-96) 134/74 (01/09 0415) SpO2:  [100 %] 100 % (01/09 0415) Last BM Date: 12/06/18 General:  Alert, thin, disheveled, in NAD; scleral icterus noted. Heart:  Regular rate and rhythm; no murmurs Pulm:  CTAB.  No increased WOB. Abdomen:  Soft, non-distended.  BS present.  Non-tender. Extremities:  Without edema. Neurologic:  Alert and oriented x 4;  grossly normal neurologically.  Rambling/tangential speech.  Intake/Output from previous day: 01/08 0701 - 01/09 0700 In: 1629.4 [P.O.:600; I.V.:902.8; IV Piggyback:126.6] Out: -   Lab Results: Recent Labs    12/05/18 0430 12/06/18 0352 12/07/18 0406  WBC 11.5* 8.0 7.1  HGB 9.1* 9.2* 9.5*  HCT 27.4* 28.0* 28.4*  PLT 459* 392 369   BMET Recent Labs    12/05/18 0430 12/06/18 0352 12/07/18 0406  NA 139 142 140  K 4.0 4.2 4.2  CL 110 110 106  CO2 22 25 25  GLUCOSE 112* 119* 97  BUN 8 11 10  CREATININE 0.42* 0.60 0.54  CALCIUM 8.3* 8.4* 8.6*   LFT Recent Labs    12/04/18 1702  12/07/18 0406  PROT 6.7   < > 5.9*  ALBUMIN 2.8*   < > 2.4*  AST 190*   < > 149*  ALT 150*   < > 130*  ALKPHOS 1,223*   < > 1,046*  BILITOT 6.5*   < > 5.4*  BILIDIR 3.8*  --    --   IBILI 2.7*  --   --    < > = values in this interval not displayed.   PT/INR Recent Labs    12/04/18 1702  LABPROT 12.2  INR 0.91   Assessment / Plan: 1.  Elevated LFTs: As above, Alk phos 1046, ALT 130, AST 149, total bilirubin 5.4, imaging showing choledocholithiasis (interestingly similar to 2018), patient does not complain of abdominal pain. 2.  Abnormal right upper quadrant ultrasound:  As above. 3.  Altered mental status: Likely multifactorial, large relation to bipolar disease and being off of medications.  -For ERCP on 1/10.  Is already on abx.  I discussed with patient.  I also tried to contact her brother, Ted, twice so far today with no answer. -Trend LFT's.   LOS: 3 days   Jessica D. Zehr  12/07/2018, 9:35 AM   

## 2018-12-07 NOTE — Care Management Important Message (Signed)
Important Message  Patient Details  Name: Kristina Knox MRN: [id number] Date of Birth: 1961-08-01   Medicare Important Message Given:  Yes    Caren Macadam 12/07/2018, 12:34 PMImportant Message  Patient Details  Name: Kristina Knox MRN: [id number] Date of Birth: 1961-07-13   Medicare Important Message Given:  Yes    Caren Macadam 12/07/2018, 12:33 PM

## 2018-12-07 NOTE — Progress Notes (Addendum)
[facility] Attending   I have taken an interval history, reviewed the chart and examined the patient. I agree with the Advanced Practitioner's note, impression and recommendations.   Trying to get ERCP and stone extraction set up for tomorrow.  I have explained it to her including risks, benefits and alternatives (no good ones here)  Gatha Mayer, MD, [facility] 12/07/2018 11:23 AM Pager [phone]    [facility] Progress Note  CC:  CBD stones/dilation  Subjective:  Denies abdominal pain.  LFT's overall unchanged.    Objective:  Vital signs in last 24 hours: Temp:  [97.8 F (36.6 C)-98.2 F (36.8 C)] 97.9 F (36.6 C) (01/09 0415) Pulse Rate:  [62-79] 62 (01/09 0415) Resp:  [16-18] 18 (01/09 0415) BP: (115-139)/(74-96) 134/74 (01/09 0415) SpO2:  [100 %] 100 % (01/09 0415) Last BM Date: 12/06/18 General:  Alert, thin, disheveled, in NAD; scleral icterus noted. Heart:  Regular rate and rhythm; no murmurs Pulm:  CTAB.  No increased WOB. Abdomen:  Soft, non-distended.  BS present.  Non-tender. Extremities:  Without edema. Neurologic:  Alert and oriented x 4;  grossly normal neurologically.  Rambling/tangential speech.  Intake/Output from previous day: 01/08 0701 - 01/09 0700 In: 1629.4 [P.O.:600; I.V.:902.8; IV Piggyback:126.6] Out: -   Lab Results: Recent Labs    12/05/18 0430 12/06/18 0352 12/07/18 0406  WBC 11.5* 8.0 7.1  HGB 9.1* 9.2* 9.5*  HCT 27.4* 28.0* 28.4*  PLT 459* 392 369   BMET Recent Labs    12/05/18 0430 12/06/18 0352 12/07/18 0406  NA 139 142 140  K 4.0 4.2 4.2  CL 110 110 106  CO2 _0 GLUCOSE 112* 119* 97  BUN _1 CREATININE 0.42* 0.60 0.54  CALCIUM 8.3* 8.4* 8.6*   LFT Recent Labs    12/04/18 1702  12/07/18 0406  PROT 6.7   < > 5.9*  ALBUMIN 2.8*   < > 2.4*  AST 190*   < > 149*  ALT 150*   < > 130*  ALKPHOS 1,223*   < > 1,046*  BILITOT 6.5*   < > 5.4*  BILIDIR 3.8*  --    --   IBILI 2.7*  --   --    < > = values in this interval not displayed.   PT/INR Recent Labs    12/04/18 1702  LABPROT 12.2  INR 0.91   Assessment / Plan: 1.  Elevated LFTs: As above, Alk phos 1046, ALT 130, AST 149, total bilirubin 5.4, imaging showing choledocholithiasis (interestingly similar to 2018), patient does not complain of abdominal pain. 2.  Abnormal right upper quadrant ultrasound:  As above. 3.  Altered mental status: Likely multifactorial, large relation to bipolar disease and being off of medications.  -For ERCP on 1/10.  Is already on abx.  I discussed with patient.  I also tried to contact her brother, Clare Gandy, twice so far today with no answer. -Trend LFT's.   LOS: 3 days   Laban Emperor. Zehr  12/07/2018, 9:35 AM

## 2018-12-08 ENCOUNTER — Encounter (HOSPITAL_COMMUNITY): Payer: Self-pay | Admitting: Certified Registered Nurse Anesthetist

## 2018-12-08 ENCOUNTER — Inpatient Hospital Stay (HOSPITAL_COMMUNITY): Payer: Medicare Other

## 2018-12-08 ENCOUNTER — Inpatient Hospital Stay (HOSPITAL_COMMUNITY): Payer: Medicare Other | Admitting: Certified Registered Nurse Anesthetist

## 2018-12-08 ENCOUNTER — Encounter (HOSPITAL_COMMUNITY)
Admission: EM | Disposition: A | Discharge status: Home / Self Care | Payer: Self-pay | Source: Home / Self Care | Attending: Internal Medicine

## 2018-12-08 HISTORY — PX: BILIARY STENT PLACEMENT: SHX5538

## 2018-12-08 HISTORY — PX: ERCP: SHX5425

## 2018-12-08 HISTORY — PX: SPHINCTEROTOMY: SHX5544

## 2018-12-08 LAB — HEPATITIS PANEL, ACUTE
HCV Ab: <0.1 s/co ratio (ref 0.0–0.9)
Hep A IgM: NEGATIVE
Hep B C IgM: NEGATIVE
Hepatitis B Surface Ag: NEGATIVE

## 2018-12-08 LAB — COMPREHENSIVE METABOLIC PANEL
ALT: 109 U/L — AB (ref 0–44)
AST: 106 U/L — ABNORMAL HIGH (ref 15–41)
Albumin: 2.7 g/dL — ABNORMAL LOW (ref 3.5–5.0)
Alkaline Phosphatase: 1030 U/L — ABNORMAL HIGH (ref 38–126)
Anion gap: 8 (ref 5–15)
BUN: 17 mg/dL (ref 6–20)
CHLORIDE: 105 mmol/L (ref 98–111)
CO2: 27 mmol/L (ref 22–32)
CREATININE: 0.62 mg/dL (ref 0.44–1.00)
Calcium: 8.8 mg/dL — ABNORMAL LOW (ref 8.9–10.3)
GFR calc Af Amer: >60 mL/min (ref >60)
GFR calc non Af Amer: >60 mL/min (ref >60)
Glucose, Bld: 103 mg/dL — ABNORMAL HIGH (ref 70–99)
Potassium: 5.1 mmol/L (ref 3.5–5.1)
Sodium: 140 mmol/L (ref 135–145)
Total Bilirubin: 5.2 mg/dL — ABNORMAL HIGH (ref 0.3–1.2)
Total Protein: 6.7 g/dL (ref 6.5–8.1)

## 2018-12-08 SURGERY — ERCP, WITH INTERVENTION IF INDICATED
Anesthesia: General

## 2018-12-08 MED ORDER — MIDAZOLAM HCL 2 MG/2ML IJ SOLN
INTRAMUSCULAR | Status: AC
  Filled 2018-12-08: qty 2

## 2018-12-08 MED ORDER — PROPOFOL 10 MG/ML IV BOLUS
INTRAVENOUS | Status: DC | PRN
  Administered 2018-12-08: 140 mg via INTRAVENOUS

## 2018-12-08 MED ORDER — ROCURONIUM BROMIDE 10 MG/ML (PF) SYRINGE
PREFILLED_SYRINGE | INTRAVENOUS | Status: DC | PRN
  Administered 2018-12-08: 40 mg via INTRAVENOUS

## 2018-12-08 MED ORDER — FENTANYL CITRATE (PF) 100 MCG/2ML IJ SOLN
INTRAMUSCULAR | Status: DC | PRN
  Administered 2018-12-08 (×2): 25 ug via INTRAVENOUS

## 2018-12-08 MED ORDER — INDOMETHACIN 50 MG RE SUPP
RECTAL | Status: AC
  Filled 2018-12-08: qty 2

## 2018-12-08 MED ORDER — GLUCAGON HCL RDNA (DIAGNOSTIC) 1 MG IJ SOLR
INTRAMUSCULAR | Status: AC
  Filled 2018-12-08: qty 1

## 2018-12-08 MED ORDER — ONDANSETRON HCL 4 MG/2ML IJ SOLN
INTRAMUSCULAR | Status: DC | PRN
  Administered 2018-12-08: 4 mg via INTRAVENOUS

## 2018-12-08 MED ORDER — SODIUM CHLORIDE 0.9 % IV SOLN
INTRAVENOUS | Status: DC | PRN
  Administered 2018-12-08: 30 mL

## 2018-12-08 MED ORDER — LIDOCAINE 2% (20 MG/ML) 5 ML SYRINGE
INTRAMUSCULAR | Status: DC | PRN
  Administered 2018-12-08: 40 mg via INTRAVENOUS

## 2018-12-08 MED ORDER — MIDAZOLAM HCL 5 MG/5ML IJ SOLN
INTRAMUSCULAR | Status: DC | PRN
  Administered 2018-12-08: 1 mg via INTRAVENOUS

## 2018-12-08 MED ORDER — SUGAMMADEX SODIUM 200 MG/2ML IV SOLN
INTRAVENOUS | Status: DC | PRN
  Administered 2018-12-08: 150 mg via INTRAVENOUS

## 2018-12-08 MED ORDER — INDOMETHACIN 50 MG RE SUPP
RECTAL | Status: DC | PRN
  Administered 2018-12-08: 100 mg via RECTAL

## 2018-12-08 MED ORDER — LACTATED RINGERS IV SOLN
INTRAVENOUS | Status: DC
  Administered 2018-12-08: 1000 mL via INTRAVENOUS

## 2018-12-08 MED ORDER — FENTANYL CITRATE (PF) 100 MCG/2ML IJ SOLN
INTRAMUSCULAR | Status: AC
  Filled 2018-12-08: qty 2

## 2018-12-08 NOTE — Op Note (Signed)
[facility] Patient Name: Kristina Knox Procedure Date: 12/08/2018 MRN: [id number] Attending MD: Iva Boop , MD Date of Birth: 1961/01/17 CSN: [id number] Age: 58 Admit Type: Inpatient Procedure:                ERCP Indications:              Bile duct stone(s), For therapy of bile duct                            stone(s) Providers:                Iva Boop, MD, Dwain Sarna, RN, Verita Schneiders, Technician, Stephanie [place], CRNA Referring MD:              Medicines:                General Anesthesia, Running IV Zosyn Complications:            No immediate complications. Estimated Blood Loss:     Estimated blood loss: none. Procedure:                Pre-Anesthesia Assessment:                           - Prior to the procedure, a History and Physical                            was performed, and patient medications and                            allergies were reviewed. The patient's tolerance of                            previous anesthesia was also reviewed. The risks                            and benefits of the procedure and the sedation                            options and risks were discussed with the patient.                            All questions were answered, and informed consent                            was obtained. Prior Anticoagulants: The patient has                            taken no previous anticoagulant or antiplatelet                            agents. ASA Grade Assessment: II - A patient with  mild systemic disease. After reviewing the risks                            and benefits, the patient was deemed in                            satisfactory condition to undergo the procedure.                           After obtaining informed consent, the scope was                            passed under direct vision. Throughout the                            procedure, the patient's  blood pressure, pulse, and                            oxygen saturations were monitored continuously. The                            [id number]-[id number] ([id number]) Olympus ERCP was introduced                            through the mouth, and used to inject contrast into                            and used to inject contrast into the bile duct. The                            ERCP was accomplished without difficulty. The                            patient tolerated the procedure well. Scope In: Scope Out: Findings:      The scout film was normal. The esophagus was successfully intubated       under direct vision. The scope was advanced to a normal major papilla in       the descending duodenum without detailed examination of the pharynx,       larynx and associated structures, and upper GI tract. The upper GI tract       was grossly normal but she had retained food contents in stomach       obscuring view more than usual.      The papilla was seen, cannulated using wire-guided technique. Contrast       injected. Difficult to fill but confirmed in CBD and saw narrowing of       mid CBD - turned out to be large stone with numerous stones proximal       into CHD and intrahepatics and cystic duct. Biliary sphincterotomy       performed over wire and then balloon catheter passed and was able to       fill out biliary tree more using occlusion cholangiogram technique. The       very large stone is lodged and no way to move it with a balloon. I  elected to place a 5 cm 10 Fr plastic stent to provide drainage -       successful placement and bile draining. Pancreas not entered by intent. Impression:               - Choledocholithiasis with an obstruction was                            found. Numerous stones proximal to a large impacted                            stone causing obstruction. 5 cm 10 Fr Stent placed                            to provide drainage. Moderate Sedation:      Not Applicable -  Patient had care per Anesthesia. Recommendation:           - Return patient to hospital ward for ongoing care.                           - Options will be multiple ERCP's with contact                            lithotripsy/Spyglass and lap chole vs open chole                            with CBD exploration and removal of stones.                           I think we have time to sort that out now that she                            is drained.                           I called brother Felicity Pellegrinied and left a message that I                            wanted to update him. Procedure Code(s):        --- Professional ---                           [phone], Endoscopic retrograde                            cholangiopancreatography (ERCP); with placement of                            endoscopic stent into biliary or pancreatic duct,                            including pre- and post-dilation and guide wire                            passage, when performed, including sphincterotomy,  when performed, each stent Diagnosis Code(s):        --- Professional ---                           K80.50, Calculus of bile duct without cholangitis                            or cholecystitis without obstruction CPT copyright 2018 [employer]. All rights reserved. The codes documented in this report are preliminary and upon coder review may  be revised to meet current compliance requirements. Iva Booparl E Jeovani Weisenburger, MD 12/08/2018 10:21:28 AM This report has been signed electronically. Number of Addenda: 0

## 2018-12-08 NOTE — Progress Notes (Signed)
Spoke to Dr. Leone Payor who performed ERCP this am.  Patient has multiple CBD stones present. He was able to get a stent in her duct, but she will need further intervention and treatment to remove all of these stones at a later time.  She did not appear to have purulent material in her duct and does not appear to have cholecystitis at this time.  She will need further GI procedures going forward.  Once these are complete and GI feels timing is appropriate, she can be referred to [place] for cholecystectomy.  No plans for acute cholecystectomy at this time.  We will sign off.  Letha Cape 2:57 PM 12/08/2018

## 2018-12-08 NOTE — Progress Notes (Addendum)
PROGRESS NOTE    Kristina Knox  OQH:[id number] DOB: July 04, 1961 DOA: 12/04/2018 PCP: Darrow Bussing, MD    Brief Narrative:  58 year old with past medical history relevant for bipolar disorder brought into the emergency department for altered mental status.  Psychiatric screening labs found mixed cholestatic and hepatocellular (predominantly cholestatic) pattern of liver injury and ultrasound showed choledocholithiasis as well as cholecystitis.  Patient was hospitalized.  Gastroenterology was consulted.   Assessment & Plan:   Acute cholecystitis/choledocholithiasis/abnormal LFTs Noted to be jaundice.  LFTs were elevated.  Patient was placed on Zosyn.  Gastroenterology was consulted.  Patient underwent ERCP this morning.  Biliary stent was placed however a large stone was found which could not be removed.  Further management per gastroenterology.  LFTs are stable.  Repeat labs daily.  Hepatitis panel is negative.  Patient was also seen by general surgery with plans for cholecystectomy during this hospitalization.  Acute metabolic encephalopathy versus her usual baseline Does not appear to be encephalopathic.  Her mentation could be baseline due to her psychiatric history.  Continue to monitor.     Bipolar disorder Patient reportedly has history of bipolar disorder and is on multiple medications for this.  Patient apparently has not been taking any of her home medications.  She was placed back on her medications.  She remains on Depakote olanzapine. And fluphenazine was also initiated by psychiatry.  HIV nonreactive.   Normocytic anemia Baseline is unknown.  Hemoglobin has been stable.  Anemia panel reviewed.  Ferritin 151.  Folate 10.6.  Vitamin B12 1440.  No evidence of overt bleeding.  High potassium level Potassium noted to be high normal.  She is noted to be getting potassium to her IV fluids.  This will be discontinued.  Recheck labs tomorrow.   Prophylaxis: SCDs  Disposition:  Pending resolution of choledocholithiasis.  Her home situation is not clear.  Social worker to assist.  Full code   Consultants:   [facility]  General surgery  Procedures:  ERCP Impression:                - Choledocholithiasis with an obstruction was found. Numerous stones proximal to a large impacted stone causing obstruction. 5 cm 10 Fr Stent placed  to provide drainage.  Antimicrobials:   IV Zosyn started 12/05/2019   Subjective: Patient seen after she returned from her ERCP.  Seems to be a little drowsy from the sedatives.  Denies any complaints.  Objective: Vitals:   12/08/18 1020 12/08/18 1030 12/08/18 1040 12/08/18 1050  BP: 133/67 130/74 133/79 (!) 159/99  Pulse:      Resp: (!) 9 13 12 15   Temp: 97.7 F (36.5 C)   97.7 F (36.5 C)  TempSrc: Oral   Oral  SpO2: 100% 99% 100% 100%  Weight:      Height:        Intake/Output Summary (Last 24 hours) at 12/08/2018 1130 Last data filed at 12/08/2018 1021 Gross per 24 hour  Intake 2804.74 ml  Output 1905 ml  Net 899.74 ml   Filed Weights   12/06/18 0648 12/08/18 0851  Weight: 47.9 kg 47.9 kg    Examination:  General appearance: High pressured speech at times. Resp: Clear to auscultation bilaterally.  Normal effort Cardio: S1-S2 is normal regular.  No S3-S4.  No rubs murmurs or bruit GI: Abdomen is soft.  Nontender nondistended.  Bowel sounds are present normal.  No masses organomegaly Extremities: No edema.  Full range of motion of lower extremities.  Neurologic: Alert and oriented x3.  No focal neurological deficits.     Data Reviewed: I have personally reviewed following labs and imaging studies  CBC: Recent Labs  Lab 12/04/18 1523 12/05/18 0430 12/06/18 0352 12/07/18 0406  WBC 10.4 11.5* 8.0 7.1  NEUTROABS 6.3  --   --   --   HGB 9.9* 9.1* 9.2* 9.5*  HCT 29.2* 27.4* 28.0* 28.4*  MCV 97.7 96.8 99.6 97.9  PLT 572* 459* 392 369   Basic Metabolic Panel: Recent Labs  Lab 12/04/18 1523  12/04/18 2129 12/05/18 0430 12/06/18 0352 12/07/18 0406 12/08/18 0401  NA 139  --  139 142 140 140  K 2.7*  --  4.0 4.2 4.2 5.1  CL 104  --  110 110 106 105  CO2 27  --  22 25 25 27   GLUCOSE 76  --  112* 119* 97 103*  BUN 11  --  8 11 10 17   CREATININE 0.56  --  0.42* 0.60 0.54 0.62  CALCIUM 8.4*  --  8.3* 8.4* 8.6* 8.8*  MG  --  1.7  --  2.1  --   --    GFR: Estimated Creatinine Clearance: 60.1 mL/min (by C-G formula based on SCr of 0.62 mg/dL).  Liver Function Tests: Recent Labs  Lab 12/04/18 1702 12/05/18 0430 12/06/18 0352 12/07/18 0406 12/08/18 0401  AST 190* 178* 195* 149* 106*  ALT 150* 132* 141* 130* 109*  ALKPHOS 1,223* 1,078* 1,103* 1,046* 1,030*  BILITOT 6.5* 6.4* 7.0* 5.4* 5.2*  PROT 6.7 5.8* 5.8* 5.9* 6.7  ALBUMIN 2.8* 2.4* 2.4* 2.4* 2.7*   Recent Labs  Lab 12/04/18 1702 12/05/18 0430  LIPASE 86* 118*   Recent Labs  Lab 12/04/18 1702  AMMONIA 50*   Coagulation Profile: Recent Labs  Lab 12/04/18 1702  INR 0.91    Recent Results (from the past 240 hour(s))  Blood culture (routine x 2)     Status: None (Preliminary result)   Collection Time: 12/04/18  7:50 PM  Result Value Ref Range Status   Specimen Description   Final    BLOOD RIGHT ANTECUBITAL Performed at [facility], [address], [place], [place] [address]    Special Requests   Final    BOTTLES DRAWN AEROBIC AND ANAEROBIC Blood Culture adequate volume Performed at [facility], [address] [place], [place] [address]    Culture   Final    NO GROWTH 4 DAYS Performed at [facility], [address], [place], [place] [address]    Report Status PENDING  Incomplete  Blood culture (routine x 2)     Status: None (Preliminary result)   Collection Time: 12/04/18  7:50 PM  Result Value Ref Range Status   Specimen Description   Final    BLOOD LEFT ANTECUBITAL Performed at [facility], [address] [place],  [place] [address]    Special Requests   Final    BOTTLES DRAWN AEROBIC AND ANAEROBIC Blood Culture adequate volume Performed at [facility], [address] [place], [place] [address]    Culture   Final    NO GROWTH 4 DAYS Performed at [facility], [address], [place], [place] [address]    Report Status PENDING  Incomplete         Radiology Studies: Dg Ercp Biliary & Pancreatic Ducts  Result Date: 12/08/2018 CLINICAL DATA:  58 year old female with a history of choledocholithiasis/cholelithiasis EXAM:  ERCP TECHNIQUE: Multiple spot images obtained with the fluoroscopic device and submitted for interpretation post-procedure. FLUOROSCOPY TIME:  3 minutes 2 seconds COMPARISON:  Ultrasound 12/04/2018 FINDINGS: Limited intraoperative fluoroscopic spot images during ERCP. Initial image demonstrates endoscope projecting over the upper abdomen, with then cannulation of the ampulla and retrograde infusion of contrast partially opacifying the extrahepatic biliary ducts with a safety wire in place. Multiple rounded filling defects are identified within the extrahepatic ducts, as well as the common hepatic duct extending towards the right and left hepatic ducts. Last image demonstrates plastic biliary stent in position with multiple retained filling defects. IMPRESSION: Limited images during ERCP demonstrates choledocholithiasis involving common bile duct, common hepatic duct, and the left and right hepatic ducts at the confluence, with treatment by placement of plastic biliary stent. Please refer to the dictated operative report for full details of intraoperative findings and procedure. Electronically Signed   By: Gilmer MorJaime  Wagner D.O.   On: 12/08/2018 10:30     Scheduled Meds: . divalproex  1,000 mg Oral QHS  . fluPHENAZine  5 mg Oral QHS  . OLANZapine  10 mg Oral QHS   Continuous Infusions: . piperacillin-tazobactam (ZOSYN)  IV 3.375 g (12/08/18 0511)     LOS: 4 days     Osvaldo ShipperGokul Rianne Degraaf, MD [facility]  If 7PM-7AM, please contact night-coverage [email or web address] Password [email or web address] 12/08/2018, 11:30 AM

## 2018-12-08 NOTE — Anesthesia Procedure Notes (Signed)
Procedure Name: Intubation Date/Time: 12/08/2018 9:24 AM Performed by: [place] Miciah Shealy C, CRNA Pre-anesthesia Checklist: Patient identified, Emergency Drugs available, Suction available and Patient being monitored Patient Re-evaluated:Patient Re-evaluated prior to induction Oxygen Delivery Method: Circle system utilized Preoxygenation: Pre-oxygenation with 100% oxygen Induction Type: IV induction Ventilation: Mask ventilation without difficulty Laryngoscope Size: Mac and 3 Grade View: Grade I Tube type: Oral Tube size: 7.0 mm Number of attempts: 1 Airway Equipment and Method: Stylet and Oral airway Placement Confirmation: ETT inserted through vocal cords under direct vision,  positive ETCO2 and breath sounds checked- equal and bilateral Secured at: 20 cm Tube secured with: Tape Dental Injury: Teeth and Oropharynx as per pre-operative assessment

## 2018-12-08 NOTE — Transfer of Care (Signed)
Immediate Anesthesia Transfer of Care Note  Patient: Kristina Knox  Procedure(s) Performed: ENDOSCOPIC RETROGRADE CHOLANGIOPANCREATOGRAPHY (ERCP) (N/A )  Patient Location: PACU and Endoscopy Unit  Anesthesia Type:general  Level of Consciousness: awake and alert   Airway & Oxygen Therapy: Patient Spontanous Breathing and Patient connected to face mask oxygen  Post-op Assessment: Report given to RN and Post -op Vital signs reviewed and stable  Post vital signs: Reviewed and stable  Last Vitals:  Vitals Value Taken Time  BP 133/67 12/08/2018 10:20 AM  Temp    Pulse 78 12/08/2018 10:20 AM  Resp 12 12/08/2018 10:20 AM  SpO2 100 % 12/08/2018 10:20 AM  Vitals shown include unvalidated device data.  Last Pain:  Vitals:   12/08/18 0851  TempSrc: Oral  PainSc: 0-No pain      Patients Stated Pain Goal: 1 (12/07/18 0930)  Complications: No apparent anesthesia complications

## 2018-12-08 NOTE — Plan of Care (Signed)
Pt had a good appetite this shift. She has been NPO since MN. Pt still semi-agitated. She gets upset about having to wear a heart monitor and be attached to IV. Pt removed HM, but was compliant when asked to let us put it back on. Pt was cooperative when her assessment was performed and when the lab came to draw blood this am. She speaks very fast when she remembers what she wants to say. Slept well during shift.

## 2018-12-08 NOTE — Interval H&P Note (Signed)
History and Physical Interval Note:  12/08/2018 9:11 AM  Kristina Knox  has presented today for surgery, with the diagnosis of CBD stones  The various methods of treatment have been discussed with the patient and family. After consideration of risks, benefits and other options for treatment, the patient has consented to  Procedure(s): ENDOSCOPIC RETROGRADE CHOLANGIOPANCREATOGRAPHY (ERCP) (N/A) as a surgical intervention .  The patient's history has been reviewed, patient examined, no change in status, stable for surgery.  I have reviewed the patient's chart and labs.  Questions were answered to the patient's satisfaction.     Stan Headarl Gessner

## 2018-12-08 NOTE — Anesthesia Preprocedure Evaluation (Signed)
Anesthesia Evaluation  Patient identified by MRN, date of birth, ID band Patient awake    Reviewed: Allergy & Precautions, NPO status , Patient's Chart, lab work & pertinent test results  Airway Mallampati: I  TM Distance: >3 FB Neck ROM: Full    Dental  (+) Teeth Intact, Dental Advisory Given   Pulmonary Current Smoker,    breath sounds clear to auscultation       Cardiovascular negative cardio ROS   Rhythm:Regular Rate:Normal     Neuro/Psych PSYCHIATRIC DISORDERS negative neurological ROS     GI/Hepatic negative GI ROS, Neg liver ROS,   Endo/Other  negative endocrine ROS  Renal/GU negative Renal ROS     Musculoskeletal negative musculoskeletal ROS (+)   Abdominal Normal abdominal exam  (+)   Peds  Hematology   Anesthesia Other Findings   Reproductive/Obstetrics                             Anesthesia Physical Anesthesia Plan  ASA: II  Anesthesia Plan: General   Post-op Pain Management:    Induction: Intravenous  PONV Risk Score and Plan: Dexamethasone and Ondansetron  Airway Management Planned: Oral ETT  Additional Equipment: None  Intra-op Plan:   Post-operative Plan: Extubation in OR  Informed Consent: I have reviewed the patients History and Physical, chart, labs and discussed the procedure including the risks, benefits and alternatives for the proposed anesthesia with the patient or authorized representative who has indicated his/her understanding and acceptance.   Dental advisory given  Plan Discussed with: CRNA  Anesthesia Plan Comments:         Anesthesia Quick Evaluation

## 2018-12-08 NOTE — Anesthesia Postprocedure Evaluation (Signed)
Anesthesia Post Note  Patient: Malachi Bondsmily S Mannor  Procedure(s) Performed: ENDOSCOPIC RETROGRADE CHOLANGIOPANCREATOGRAPHY (ERCP) (N/A ) SPHINCTEROTOMY BILIARY STENT PLACEMENT (N/A )     Patient location during evaluation: PACU Anesthesia Type: General Level of consciousness: awake and alert Pain management: pain level controlled Vital Signs Assessment: post-procedure vital signs reviewed and stable Respiratory status: spontaneous breathing, nonlabored ventilation, respiratory function stable and patient connected to nasal cannula oxygen Cardiovascular status: blood pressure returned to baseline and stable Postop Assessment: no apparent nausea or vomiting Anesthetic complications: no    Last Vitals:  Vitals:   12/08/18 1050 12/08/18 1219  BP: (!) 159/99 (!) 167/81  Pulse:  62  Resp: 15 18  Temp: 36.5 C   SpO2: 100% 100%    Last Pain:  Vitals:   12/08/18 1050  TempSrc: Oral  PainSc: 0-No pain                 Shelton SilvasKevin D Jance Siek

## 2018-12-09 LAB — COMPREHENSIVE METABOLIC PANEL
ALT: 91 U/L — ABNORMAL HIGH (ref 0–44)
AST: 93 U/L — ABNORMAL HIGH (ref 15–41)
Albumin: 2.7 g/dL — ABNORMAL LOW (ref 3.5–5.0)
Alkaline Phosphatase: 946 U/L — ABNORMAL HIGH (ref 38–126)
Anion gap: 8 (ref 5–15)
BUN: 9 mg/dL (ref 6–20)
CO2: 30 mmol/L (ref 22–32)
Calcium: 8.9 mg/dL (ref 8.9–10.3)
Chloride: 101 mmol/L (ref 98–111)
Creatinine, Ser: 0.63 mg/dL (ref 0.44–1.00)
GFR calc Af Amer: >60 mL/min (ref >60)
GFR calc non Af Amer: >60 mL/min (ref >60)
Glucose, Bld: 86 mg/dL (ref 70–99)
Potassium: 4.5 mmol/L (ref 3.5–5.1)
SODIUM: 139 mmol/L (ref 135–145)
Total Bilirubin: 4.3 mg/dL — ABNORMAL HIGH (ref 0.3–1.2)
Total Protein: 6.4 g/dL — ABNORMAL LOW (ref 6.5–8.1)

## 2018-12-09 LAB — CULTURE, BLOOD (ROUTINE X 2)
CULTURE: NO GROWTH
Culture: NO GROWTH
Special Requests: ADEQUATE
Special Requests: ADEQUATE

## 2018-12-09 LAB — CBC
HCT: 30 % — ABNORMAL LOW (ref 36.0–46.0)
Hemoglobin: 9.7 g/dL — ABNORMAL LOW (ref 12.0–15.0)
MCH: 32.2 pg (ref 26.0–34.0)
MCHC: 32.3 g/dL (ref 30.0–36.0)
MCV: 99.7 fL (ref 80.0–100.0)
Platelets: 413 10*3/uL — ABNORMAL HIGH (ref 150–400)
RBC: 3.01 MIL/uL — AB (ref 3.87–5.11)
RDW: 17.4 % — ABNORMAL HIGH (ref 11.5–15.5)
WBC: 7.1 10*3/uL (ref 4.0–10.5)
nRBC: 0 % (ref 0.0–0.2)

## 2018-12-09 LAB — LIPASE, BLOOD: Lipase: 40 U/L (ref 11–51)

## 2018-12-09 NOTE — Progress Notes (Addendum)
PROGRESS NOTE    Kristina Knox  ZOX:[id number] DOB: [birth year] DOA: 12/04/2018 PCP: Darrow Bussing, MD    Brief Narrative:  58 year old with past medical history relevant for bipolar disorder brought into the emergency department for altered mental status.  Psychiatric screening labs found mixed cholestatic and hepatocellular (predominantly cholestatic) pattern of liver injury and ultrasound showed choledocholithiasis as well as cholecystitis.  Patient was hospitalized.  Gastroenterology was consulted.  Patient underwent ERCP with stent placement.  However the stones were too large to be removed.   Assessment & Plan:   Acute cholecystitis/choledocholithiasis/abnormal LFTs Noted to be jaundice.  LFTs were elevated.  Patient was placed on Zosyn.  Gastroenterology was consulted.  Patient underwent ERCP on 1/10.  Biliary stent was placed however a large stone was found which could not be removed.  LFTs noted to be slightly better today.  Hepatitis panel negative.  Unclear if the plan is to repeat ERCP in the near future or a surgical approach is to be considered. Patient also on Zosyn which could conceivably be stopped now.  Will wait on gastroenterology input.   Acute metabolic encephalopathy versus her usual baseline Does not appear to be encephalopathic.  Her mentation could be baseline due to her psychiatric history.  Continue to monitor.     Bipolar disorder Patient reportedly has history of bipolar disorder and is on multiple medications for this.  Patient apparently has not been taking any of her home medications.  Patient was seen by psychiatry.  She was placed back on her medications.  She remains on Depakote olanzapine. And fluphenazine was also initiated by psychiatry.  HIV nonreactive.   Normocytic anemia Baseline is unknown.  Hemoglobin has been stable.  Anemia panel reviewed.  Ferritin 151.  Folate 10.6.  Vitamin B12 1440.  No evidence of overt bleeding.  High potassium  level Potassium noted to be high normal on 1/10.  She was taken off of potassium in her IV fluids.  Potassium better today.    Discussed with patient's brother who is her legal guardian.  He lives in [place].  He was under the impression that patient was going to be involuntarily committed as per his discussions with Mr. Crist Fat few days ago.  He was very upset that patient was cleared by psychiatry.  Will request psychiatry to talk to the patient's brother on Monday.  DVT Prophylaxis: SCDs Disposition: Await further GI input regarding her choledocholithiasis and antibiotics.  Her home situation is also not entirely clear. CODE STATUS: Full code   Consultants:   [facility]  General surgery  Procedures:  ERCP Impression:                - Choledocholithiasis with an obstruction was found. Numerous stones proximal to a large impacted stone causing obstruction. 5 cm 10 Fr Stent placed  to provide drainage.  Antimicrobials:   IV Zosyn started 12/05/2019   Subjective: Patient denies any abdominal pain nausea or vomiting.  Objective: Vitals:   12/08/18 1050 12/08/18 1219 12/08/18 2132 12/09/18 0416  BP: (!) 159/99 (!) 167/81 (!) 152/66 138/79  Pulse:  62 61 67  Resp: 15 18 18 18   Temp: 97.7 F (36.5 C)  97.6 F (36.4 C) 98.3 F (36.8 C)  TempSrc: Oral     SpO2: 100% 100% 100% 100%  Weight:      Height:        Intake/Output Summary (Last 24 hours) at 12/09/2018 1247 Last data filed at 12/09/2018 1114 Gross per  24 hour  Intake 661.62 ml  Output 2500 ml  Net -1838.38 ml   Filed Weights   12/06/18 0648 12/08/18 0851  Weight: 47.9 kg 47.9 kg    Examination: General appearance: High pressured speech at times Resp: Clear to auscultation bilaterally.  Normal effort Cardio: S1-S2 is normal regular.  No S3-S4.  No rubs murmurs or bruit GI: Abdomen is soft.  Nontender nondistended.  Bowel sounds are present normal.  No masses organomegaly Extremities: No edema.  Full range  of motion of lower extremities. Neurologic: Alert.  No focal neurological deficits.    Data Reviewed: I have personally reviewed following labs and imaging studies  CBC: Recent Labs  Lab 12/04/18 1523 12/05/18 0430 12/06/18 0352 12/07/18 0406 12/09/18 0525  WBC 10.4 11.5* 8.0 7.1 7.1  NEUTROABS 6.3  --   --   --   --   HGB 9.9* 9.1* 9.2* 9.5* 9.7*  HCT 29.2* 27.4* 28.0* 28.4* 30.0*  MCV 97.7 96.8 99.6 97.9 99.7  PLT 572* 459* 392 369 413*   Basic Metabolic Panel: Recent Labs  Lab 12/04/18 2129 12/05/18 0430 12/06/18 0352 12/07/18 0406 12/08/18 0401 12/09/18 0525  NA  --  139 142 140 140 139  K  --  4.0 4.2 4.2 5.1 4.5  CL  --  110 110 106 105 101  CO2  --  22 25 25 27 30   GLUCOSE  --  112* 119* 97 103* 86  BUN  --  8 11 10 17 9   CREATININE  --  0.42* 0.60 0.54 0.62 0.63  CALCIUM  --  8.3* 8.4* 8.6* 8.8* 8.9  MG 1.7  --  2.1  --   --   --    GFR: Estimated Creatinine Clearance: 60.1 mL/min (by C-G formula based on SCr of 0.63 mg/dL).  Liver Function Tests: Recent Labs  Lab 12/05/18 0430 12/06/18 0352 12/07/18 0406 12/08/18 0401 12/09/18 0525  AST 178* 195* 149* 106* 93*  ALT 132* 141* 130* 109* 91*  ALKPHOS 1,078* 1,103* 1,046* 1,030* 946*  BILITOT 6.4* 7.0* 5.4* 5.2* 4.3*  PROT 5.8* 5.8* 5.9* 6.7 6.4*  ALBUMIN 2.4* 2.4* 2.4* 2.7* 2.7*   Recent Labs  Lab 12/04/18 1702 12/05/18 0430 12/09/18 0525  LIPASE 86* 118* 40   Recent Labs  Lab 12/04/18 1702  AMMONIA 50*   Coagulation Profile: Recent Labs  Lab 12/04/18 1702  INR 0.91    Recent Results (from the past 240 hour(s))  Blood culture (routine x 2)     Status: None   Collection Time: 12/04/18  7:50 PM  Result Value Ref Range Status   Specimen Description   Final    BLOOD RIGHT ANTECUBITAL Performed at [facility] [address], [place], [place] [address]    Special Requests   Final    BOTTLES DRAWN AEROBIC AND ANAEROBIC Blood Culture adequate volume Performed  at [facility], [address] [place], [place] [address]    Culture   Final    NO GROWTH 5 DAYS Performed at [facility], [address], [place], [place] [address]    Report Status 12/09/2018 FINAL  Final  Blood culture (routine x 2)     Status: None   Collection Time: 12/04/18  7:50 PM  Result Value Ref Range Status   Specimen Description   Final    BLOOD LEFT ANTECUBITAL Performed at [facility], [address] [place], [place] [address]  Special Requests   Final    BOTTLES DRAWN AEROBIC AND ANAEROBIC Blood Culture adequate volume Performed at [facility], [address] [place], [place] [address]    Culture   Final    NO GROWTH 5 DAYS Performed at [facility], [address] [place], [place] [address]    Report Status 12/09/2018 FINAL  Final         Radiology Studies: Dg Ercp Biliary & Pancreatic Ducts  Result Date: 12/08/2018 CLINICAL DATA:  58 year old female with a history of choledocholithiasis/cholelithiasis EXAM: ERCP TECHNIQUE: Multiple spot images obtained with the fluoroscopic device and submitted for interpretation post-procedure. FLUOROSCOPY TIME:  3 minutes 2 seconds COMPARISON:  Ultrasound 12/04/2018 FINDINGS: Limited intraoperative fluoroscopic spot images during ERCP. Initial image demonstrates endoscope projecting over the upper abdomen, with then cannulation of the ampulla and retrograde infusion of contrast partially opacifying the extrahepatic biliary ducts with a safety wire in place. Multiple rounded filling defects are identified within the extrahepatic ducts, as well as the common hepatic duct extending towards the right and left hepatic ducts. Last image demonstrates plastic biliary stent in position with multiple retained filling defects. IMPRESSION: Limited images during ERCP demonstrates choledocholithiasis involving common bile duct, common hepatic duct, and the left and  right hepatic ducts at the confluence, with treatment by placement of plastic biliary stent. Please refer to the dictated operative report for full details of intraoperative findings and procedure. Electronically Signed   By: Gilmer MorJaime  Wagner D.O.   On: 12/08/2018 10:30     Scheduled Meds: . divalproex  1,000 mg Oral QHS  . fluPHENAZine  5 mg Oral QHS  . OLANZapine  10 mg Oral QHS   Continuous Infusions: . piperacillin-tazobactam (ZOSYN)  IV 3.375 g (12/09/18 0435)     LOS: 5 days    Osvaldo ShipperGokul Devera Englander, MD [facility]  If 7PM-7AM, please contact night-coverage [email or web address]   12/09/2018, 12:47 PM

## 2018-12-09 NOTE — Progress Notes (Addendum)
Daily Rounding Note  12/09/2018, 11:31 AM  LOS: 5 days   SUBJECTIVE:   Chief complaint:  Abdominal pain.   CBD stones, elevated LFTs   No abdominal pain, no nausea.  Hungry.   Asking where she will go to live after discharge.    OBJECTIVE:         Vital signs in last 24 hours:    Temp:  [97.6 F (36.4 C)-98.3 F (36.8 C)] 98.3 F (36.8 C) (01/11 0416) Pulse Rate:  [61-67] 67 (01/11 0416) Resp:  [18] 18 (01/11 0416) BP: (138-167)/(66-81) 138/79 (01/11 0416) SpO2:  [100 %] 100 % (01/11 0416) Last BM Date: 12/07/18 Filed Weights   12/06/18 0648 12/08/18 0851  Weight: 47.9 kg 47.9 kg   General: physically looks well.  Manic speech pattern   Heart: RRR Chest: clear bil Abdomen: soft, NT, ND.  Active BS  Extremities: no CCE Neuro/Psych:  Rapid speech with flight of ideas but oriented to date, place.  Follows commands.     Intake/Output from previous day: 01/10 0701 - 01/11 0700 In: 861.6 [I.V.:700; IV Piggyback:161.6] Out: 2105 [Urine:2100; Blood:5]  Intake/Output this shift: Total I/O In: 500 [P.O.:500] Out: 1500 [Urine:1500]  Lab Results: Recent Labs    12/07/18 0406 12/09/18 0525  WBC 7.1 7.1  HGB 9.5* 9.7*  HCT 28.4* 30.0*  PLT 369 413*    LFT Recent Labs    12/07/18 0406 12/08/18 0401 12/09/18 0525  PROT 5.9* 6.7 6.4*  ALBUMIN 2.4* 2.7* 2.7*  AST 149* 106* 93*  ALT 130* 109* 91*  ALKPHOS 1,046* 1,030* 946*  BILITOT 5.4* 5.2* 4.3*   Studies/Results: Dg Ercp Biliary & Pancreatic Ducts  Result Date: 12/08/2018 CLINICAL DATA:  58 year old female with a history of choledocholithiasis/cholelithiasis EXAM: ERCP TECHNIQUE: Multiple spot images obtained with the fluoroscopic device and submitted for interpretation post-procedure. FLUOROSCOPY TIME:  3 minutes 2 seconds COMPARISON:  Ultrasound 12/04/2018 FINDINGS: Limited intraoperative fluoroscopic spot images during ERCP. Initial image  demonstrates endoscope projecting over the upper abdomen, with then cannulation of the ampulla and retrograde infusion of contrast partially opacifying the extrahepatic biliary ducts with a safety wire in place. Multiple rounded filling defects are identified within the extrahepatic ducts, as well as the common hepatic duct extending towards the right and left hepatic ducts. Last image demonstrates plastic biliary stent in position with multiple retained filling defects. IMPRESSION: Limited images during ERCP demonstrates choledocholithiasis involving common bile duct, common hepatic duct, and the left and right hepatic ducts at the confluence, with treatment by placement of plastic biliary stent. Please refer to the dictated operative report for full details of intraoperative findings and procedure. Electronically Signed   By: Gilmer Mor D.O.   On: 12/08/2018 10:30    ASSESMENT:   *   Choledocholithiasis S/p 12/08/18 ERCP with stent placement to bile duct due to large, obstructing, impacted stone.  LFTs steadily improving.  No growth on blood clx    *   Cholelithiasis.    *    Normocytic anemia.  No comp levels for comparison.  Iron, B12, Folate levels ok.   No prior EGD, colonoscopy  *   Bipolar disorder.     PLAN   *   Options will be multiple ERCP's with contact ithotripsy/Spyglass and lap chole vs open chole with CBD exploration and removal of stones. Timing per Dr Leone Payor.   Per Dr Gerrit Friends:  Repeat ERCP(s) and refer for lap chole once  stones extracted.    *  ? How long to continue the Zosyn?       Jennye MoccasinSarah Gribbin  12/09/2018, 11:31 AM Phone [phone]    [facility] Attending   I have taken an interval history, reviewed the chart and examined the patient. I agree with the Advanced Practitioner's note, impression and recommendations.   She does not need Zosyn - I stopped it. She has been adequately treated - albeit temporarily - for the choledocholithiasis She needs to  undergo another (at least) ERCP with contact lithotripsy using cholangioscope - will need to arrange and it may be as an outpatient though there is some possibility (small) could be done next week  So she could go home from GI standpoint but her psych and social issues (she is essentially homeless right noiw) are complicating things.  Iva Booparl E. Gessner, MD, [facility] 12/09/2018 7:10 PM Pager [phone]

## 2018-12-10 LAB — COMPREHENSIVE METABOLIC PANEL
ALT: 83 U/L — AB (ref 0–44)
AST: 88 U/L — ABNORMAL HIGH (ref 15–41)
Albumin: 2.8 g/dL — ABNORMAL LOW (ref 3.5–5.0)
Alkaline Phosphatase: 1011 U/L — ABNORMAL HIGH (ref 38–126)
Anion gap: 11 (ref 5–15)
BUN: 13 mg/dL (ref 6–20)
CHLORIDE: 102 mmol/L (ref 98–111)
CO2: 28 mmol/L (ref 22–32)
Calcium: 9.1 mg/dL (ref 8.9–10.3)
Creatinine, Ser: 0.71 mg/dL (ref 0.44–1.00)
GFR calc non Af Amer: >60 mL/min (ref >60)
Glucose, Bld: 108 mg/dL — ABNORMAL HIGH (ref 70–99)
Potassium: 4.2 mmol/L (ref 3.5–5.1)
Sodium: 141 mmol/L (ref 135–145)
Total Bilirubin: 4 mg/dL — ABNORMAL HIGH (ref 0.3–1.2)
Total Protein: 6.7 g/dL (ref 6.5–8.1)

## 2018-12-10 NOTE — Progress Notes (Addendum)
          Daily Rounding Note  12/10/2018, 9:51 AM  LOS: 6 days   SUBJECTIVE:   Chief complaint: impacted CBD stones    No abdominal pain or nausea.   Still talking about past issues with her housing  OBJECTIVE:         Vital signs in last 24 hours:    Temp:  [98.1 F (36.7 C)-98.7 F (37.1 C)] 98.3 F (36.8 C) (01/12 0453) Pulse Rate:  [67-80] 79 (01/12 0453) Resp:  [10-16] 14 (01/12 0453) BP: (121-123)/(68-75) 121/68 (01/12 0453) SpO2:  [98 %-99 %] 99 % (01/12 0453) Last BM Date: 12/07/18 Filed Weights   12/06/18 0648 12/08/18 0851  Weight: 47.9 kg 47.9 kg   General: looks physically well.  Slight icteus   Heart: RRR Chest: clear bil.  Slight dry cough Abdomen: soft, NT, ND.  Active BS  Extremities: no CCE Neuro/Psych:  Flight of ideas, rambling/pressured speech.  Cooperative, polite.  Moves all 4 limbs  Intake/Output from previous day: 01/11 0701 - 01/12 0700 In: 2800 [P.O.:2780; IV Piggyback:20] Out: 5537 [Urine:3150]  Intake/Output this shift: Total I/O In: 480 [P.O.:480] Out: -   Lab Results: Recent Labs    12/09/18 0525  WBC 7.1  HGB 9.7*  HCT 30.0*  PLT 413*   LFT Recent Labs    12/08/18 0401 12/09/18 0525 12/10/18 0432  PROT 6.7 6.4* 6.7  ALBUMIN 2.7* 2.7* 2.8*  AST 106* 93* 88*  ALT 109* 91* 83*  ALKPHOS 1,030* 946* 1,011*  BILITOT 5.2* 4.3* 4.0*     ASSESMENT:   *   Choledocholithiasis.   S/p 12/08/18 ERCP with stent placement to bile duct due to large, obstructing, impacted stone.  LFTs improving but alk phos lagging.  No growth on blood clx    *   Cholelithiasis.  Gen surgery plan lap chole once issues with CBD stones resolved.    *    Normocytic anemia.  No comp levels for comparison.  Iron, B12, Folate levels ok.   No prior EGD, colonoscopy  *   Bipolar disorder.  sxs florid, seems like meds need adjusting.       PLAN   *   Timing of next ERCP with spyglass, stone  extraction to be determined.      Azucena Freed  12/10/2018, 9:51 AM Phone [phone]  I have assessed the patient also. Agree with Ms. Sandi Carne assessment and plan. We will work out a plan for stone extraction soon but stent adequate Tx now I think Gatha Mayer, MD, [facility]

## 2018-12-10 NOTE — Progress Notes (Signed)
PROGRESS NOTE    Kristina Knox S Farnan  UEA:[id number]:[id number] DOB: 07/27/1961 DOA: 12/04/2018 PCP: [name], Dibas, MD    Brief Narrative:  58 year old with past medical history relevant for bipolar disorder brought into the emergency department for altered mental status.  Psychiatric screening labs found mixed cholestatic and hepatocellular (predominantly cholestatic) pattern of liver injury and ultrasound showed choledocholithiasis as well as cholecystitis.  Patient was hospitalized.  Gastroenterology was consulted.  Patient underwent ERCP with stent placement.  However the stones were too large to be removed.   Assessment & Plan:   Acute cholecystitis/choledocholithiasis/abnormal LFTs Noted to be jaundice.  LFTs were elevated.  Patient was placed on Zosyn.  Gastroenterology was consulted.  Patient underwent ERCP on 1/10.  Biliary stent was placed however a large stone was found which could not be removed.  LFTs are slowly improving.  Hepatitis panel negative.  Zosyn was discontinued.  Per gastroenterology she may need to undergo ERCP in the near future.  They will determine timing.  Will eventually need a cholecystectomy.    Acute metabolic encephalopathy versus her usual baseline Does not appear to be encephalopathic.  Her mentation could be baseline due to her psychiatric history.  Continue to monitor.     Bipolar disorder Patient reportedly has history of bipolar disorder and is on multiple medications for this.  Patient apparently has not been taking any of her home medications.  Patient was seen by psychiatry.  She was placed back on her medications.  She remains on Depakote olanzapine. And fluphenazine was also initiated by psychiatry.  HIV nonreactive.   Normocytic anemia Baseline is unknown.  Hemoglobin has been stable.  Anemia panel reviewed.  Ferritin 151.  Folate 10.6.  Vitamin B12 1440.  No evidence of overt bleeding.  High potassium level Potassium noted to be high normal on 1/10.  She was  taken off of potassium in her IV fluids.  Potassium normal and stable.    Social situation Apparently patient does not have a place to live anymore.  Her apartment is inhabitable.  Discussed with her brother yesterday, Kristina Knox.  He was under the impression that the patient was going to be admitted to behavioral health after medical issues had become stable.  No such plan per psychiatry notes.  He is requesting to talk to psychiatry.  We will request them to do so tomorrow.   DVT Prophylaxis: SCDs Disposition: Management as outlined above.  Will consult social worker due to her social situation.. CODE STATUS: Full code   Consultants:   [facility]  General surgery  Procedures:  ERCP Impression:                - Choledocholithiasis with an obstruction was found. Numerous stones proximal to a large impacted stone causing obstruction. 5 cm 10 Fr Stent placed  to provide drainage.  Antimicrobials:   IV Zosyn started 12/05/2019.  Discontinued on 1/11   Subjective: Patient with pressured speech.  Distracted.  But denies any abdominal pain.  Objective: Vitals:   12/09/18 0416 12/09/18 1259 12/09/18 2000 12/10/18 0453  BP: 138/79 123/71 122/75 121/68  Pulse: 67 80 67 79  Resp: 18 10 16 14   Temp: 98.3 F (36.8 C) 98.1 F (36.7 C) 98.7 F (37.1 C) 98.3 F (36.8 C)  TempSrc:  Oral Oral Oral  SpO2: 100% 99% 98% 99%  Weight:      Height:        Intake/Output Summary (Last 24 hours) at 12/10/2018 [id number] Last data  filed at 12/10/2018 0855 Gross per 24 hour  Intake 2540.03 ml  Output 2050 ml  Net 490.03 ml   Filed Weights   12/06/18 0648 12/08/18 0851  Weight: 47.9 kg 47.9 kg    Examination:  General appearance: Awake alert.  In no distress Resp: Clear to auscultation bilaterally.  Normal effort Cardio: S1-S2 is normal regular.  No S3-S4.  No rubs murmurs or bruit GI: Abdomen is soft.  Nontender nondistended.  Bowel sounds are present normal.  No masses  organomegaly Extremities: No edema.  Full range of motion of lower extremities. Neurologic: Alert and oriented x3.  No focal neurological deficits.    Data Reviewed: I have personally reviewed following labs and imaging studies  CBC: Recent Labs  Lab 12/04/18 1523 12/05/18 0430 12/06/18 0352 12/07/18 0406 12/09/18 0525  WBC 10.4 11.5* 8.0 7.1 7.1  NEUTROABS 6.3  --   --   --   --   HGB 9.9* 9.1* 9.2* 9.5* 9.7*  HCT 29.2* 27.4* 28.0* 28.4* 30.0*  MCV 97.7 96.8 99.6 97.9 99.7  PLT 572* 459* 392 369 413*   Basic Metabolic Panel: Recent Labs  Lab 12/04/18 2129  12/06/18 0352 12/07/18 0406 12/08/18 0401 12/09/18 0525 12/10/18 0432  NA  --    < > 142 140 140 139 141  K  --    < > 4.2 4.2 5.1 4.5 4.2  CL  --    < > 110 106 105 101 102  CO2  --    < > 25 25 27 30 28   GLUCOSE  --    < > 119* 97 103* 86 108*  BUN  --    < > 11 10 17 9 13   CREATININE  --    < > 0.60 0.54 0.62 0.63 0.71  CALCIUM  --    < > 8.4* 8.6* 8.8* 8.9 9.1  MG 1.7  --  2.1  --   --   --   --    < > = values in this interval not displayed.   GFR: Estimated Creatinine Clearance: 60.1 mL/min (by C-G formula based on SCr of 0.71 mg/dL).  Liver Function Tests: Recent Labs  Lab 12/06/18 0352 12/07/18 0406 12/08/18 0401 12/09/18 0525 12/10/18 0432  AST 195* 149* 106* 93* 88*  ALT 141* 130* 109* 91* 83*  ALKPHOS 1,103* 1,046* 1,030* 946* 1,011*  BILITOT 7.0* 5.4* 5.2* 4.3* 4.0*  PROT 5.8* 5.9* 6.7 6.4* 6.7  ALBUMIN 2.4* 2.4* 2.7* 2.7* 2.8*   Recent Labs  Lab 12/04/18 1702 12/05/18 0430 12/09/18 0525  LIPASE 86* 118* 40   Recent Labs  Lab 12/04/18 1702  AMMONIA 50*   Coagulation Profile: Recent Labs  Lab 12/04/18 1702  INR 0.91    Recent Results (from the past 240 hour(s))  Blood culture (routine x 2)     Status: None   Collection Time: 12/04/18  7:50 PM  Result Value Ref Range Status   Specimen Description   Final    BLOOD RIGHT ANTECUBITAL Performed at [facility], [address], [place], [place] [address]    Special Requests   Final    BOTTLES DRAWN AEROBIC AND ANAEROBIC Blood Culture adequate volume Performed at [facility], [address], [place], [place] [address]    Culture   Final    NO GROWTH 5 DAYS Performed at [facility], [address] [place], [place] [address]    Report Status  12/09/2018 FINAL  Final  Blood culture (routine x 2)     Status: None   Collection Time: 12/04/18  7:50 PM  Result Value Ref Range Status   Specimen Description   Final    BLOOD LEFT ANTECUBITAL Performed at [facility], [address] [place], [place] [address]    Special Requests   Final    BOTTLES DRAWN AEROBIC AND ANAEROBIC Blood Culture adequate volume Performed at [facility], [address] [place], [place] [address]    Culture   Final    NO GROWTH 5 DAYS Performed at [facility], [address] [place], [place] [address]    Report Status 12/09/2018 FINAL  Final         Radiology Studies: Dg Ercp Biliary & Pancreatic Ducts  Result Date: 12/08/2018 CLINICAL DATA:  58 year old female with a history of choledocholithiasis/cholelithiasis EXAM: ERCP TECHNIQUE: Multiple spot images obtained with the fluoroscopic device and submitted for interpretation post-procedure. FLUOROSCOPY TIME:  3 minutes 2 seconds COMPARISON:  Ultrasound 12/04/2018 FINDINGS: Limited intraoperative fluoroscopic spot images during ERCP. Initial image demonstrates endoscope projecting over the upper abdomen, with then cannulation of the ampulla and retrograde infusion of contrast partially opacifying the extrahepatic biliary ducts with a safety wire in place. Multiple rounded filling defects are identified within the extrahepatic ducts, as well as the common hepatic duct extending towards the right and left hepatic ducts. Last image demonstrates plastic biliary stent in position with multiple  retained filling defects. IMPRESSION: Limited images during ERCP demonstrates choledocholithiasis involving common bile duct, common hepatic duct, and the left and right hepatic ducts at the confluence, with treatment by placement of plastic biliary stent. Please refer to the dictated operative report for full details of intraoperative findings and procedure. Electronically Signed   By: Gilmer MorJaime  Wagner D.O.   On: 12/08/2018 10:30     Scheduled Meds: . divalproex  1,000 mg Oral QHS  . fluPHENAZine  5 mg Oral QHS  . OLANZapine  10 mg Oral QHS   Continuous Infusions:    LOS: 6 days    Osvaldo ShipperGokul Nakul Avino, MD [facility]  If 7PM-7AM, please contact night-coverage [email or web address]   12/10/2018, 9:53 AM

## 2018-12-11 ENCOUNTER — Other Ambulatory Visit: Payer: Self-pay

## 2018-12-11 ENCOUNTER — Telehealth: Payer: Self-pay

## 2018-12-11 DIAGNOSIS — K8041 Calculus of bile duct with cholecystitis, unspecified, with obstruction: Secondary | ICD-10-CM

## 2018-12-11 DIAGNOSIS — K8051 Calculus of bile duct without cholangitis or cholecystitis with obstruction: Secondary | ICD-10-CM

## 2018-12-11 LAB — COMPREHENSIVE METABOLIC PANEL
ALT: 75 U/L — ABNORMAL HIGH (ref 0–44)
AST: 69 U/L — AB (ref 15–41)
Albumin: 3.1 g/dL — ABNORMAL LOW (ref 3.5–5.0)
Alkaline Phosphatase: 924 U/L — ABNORMAL HIGH (ref 38–126)
Anion gap: 13 (ref 5–15)
BUN: 23 mg/dL — AB (ref 6–20)
CO2: 22 mmol/L (ref 22–32)
Calcium: 9 mg/dL (ref 8.9–10.3)
Chloride: 103 mmol/L (ref 98–111)
Creatinine, Ser: 0.57 mg/dL (ref 0.44–1.00)
GFR calc Af Amer: >60 mL/min (ref >60)
GFR calc non Af Amer: >60 mL/min (ref >60)
Glucose, Bld: 116 mg/dL — ABNORMAL HIGH (ref 70–99)
POTASSIUM: 3.9 mmol/L (ref 3.5–5.1)
Sodium: 138 mmol/L (ref 135–145)
Total Bilirubin: 3.6 mg/dL — ABNORMAL HIGH (ref 0.3–1.2)
Total Protein: 7.4 g/dL (ref 6.5–8.1)

## 2018-12-11 MED ORDER — INFLUENZA VAC SPLIT QUAD 0.5 ML IM SUSY
0.5000 mL | PREFILLED_SYRINGE | Freq: Once | INTRAMUSCULAR | Status: AC
  Administered 2018-12-11: 0.5 mL via INTRAMUSCULAR
  Filled 2018-12-11: qty 0.5

## 2018-12-11 NOTE — Progress Notes (Signed)
Progress Note   Subjective  Chief Complaint: Impacted CBD stones  No abdominal pain or nausea, having normal bowel movements per her.   Objective   Vital signs in last 24 hours: Temp:  [98.1 F (36.7 C)-98.2 F (36.8 C)] 98.1 F (36.7 C) (01/13 0443) Pulse Rate:  [85-95] 95 (01/13 0443) Resp:  [14-18] 18 (01/13 0443) BP: (134-144)/(86-94) 134/87 (01/13 0443) SpO2:  [100 %] 100 % (01/13 0443) Last BM Date: 12/10/18 General:    white female in NAD Heart:  Regular rate and rhythm; no murmurs Lungs: Respirations even and unlabored, lungs CTA bilaterally Abdomen:  Soft, nontender and nondistended. Normal bowel sounds. Extremities:  Without edema. Psych:  Cooperative.  Flight of ideas/rambling speech  Intake/Output from previous day: 01/12 0701 - 01/13 0700 In: 680 [P.O.:680] Out: -   Lab Results: Recent Labs    12/09/18 0525  WBC 7.1  HGB 9.7*  HCT 30.0*  PLT 413*   BMET Recent Labs    12/09/18 0525 12/10/18 0432 12/11/18 0822  NA 139 141 138  K 4.5 4.2 3.9  CL 101 102 103  CO2 '30 28 22  '$ GLUCOSE 86 108* 116*  BUN 9 13 23*  CREATININE 0.63 0.71 0.57  CALCIUM 8.9 9.1 9.0   LFT Recent Labs    12/11/18 0822  PROT 7.4  ALBUMIN 3.1*  AST 69*  ALT 75*  ALKPHOS 924*  BILITOT 3.6*     Assessment / Plan:   Assessment: 1.  Choledocholithiasis: Status post ERCP 12/08/2018 with stent placement of bile duct due to large, obstructing, impacted stone, LFTs improving but alk phos mildly increased today, no growth on blood cultures 2.  Cholelithiasis: General surgery plan lap chole once issue CBD stones resolved 3.  Normocytic anemia: Iron, B12, folate levels okay, no prior EGD or colonoscopy 4.  Bipolar disorder  Plan: 1.  Timing of next ERCP with spyglass/stone extraction per Dr. Ardis Hughs. 2.  Continue current supportive measures and monitoring of LFTs 3.  Please await further recommendations from Dr. Ardis Hughs later today  Thank you for your kind  consultation, we will continue to follow.   LOS: 7 days   Levin Erp  12/11/2018, 9:31 AM

## 2018-12-11 NOTE — Telephone Encounter (Signed)
appt made with jessica for 1/31 at 830 am lab order has been added to Epic.

## 2018-12-11 NOTE — Progress Notes (Signed)
PROGRESS NOTE    Kristina Knox  GMW:[id number] DOB: 04/23/1961 DOA: 12/04/2018 PCP: Kristina Knox, Kristina Knox    Brief Narrative:  58 year old with past medical history relevant for bipolar disorder brought into the emergency department for altered mental status.  Psychiatric screening labs found mixed cholestatic and hepatocellular (predominantly cholestatic) pattern of liver injury and ultrasound showed choledocholithiasis as well as cholecystitis.  Patient was hospitalized.  Gastroenterology was consulted.  Patient underwent ERCP with stent placement.  However the stones were too large to be removed.   Assessment & Plan:   Acute cholecystitis/choledocholithiasis/abnormal LFTs Noted to be jaundice.  LFTs were elevated.  Patient was placed on Zosyn.  Gastroenterology was consulted.  Patient underwent ERCP on 1/10.  Biliary stent was placed however a large stone was found which could not be removed.  LFTs are slowly improving.  Hepatitis panel negative.  Zosyn was discontinued.  Per gastroenterology she may need to undergo ERCP in the near future.  Discussed with Dr. Christella Knox this morning.  Plan will be to do ERCP in a few weeks time.  Patient will eventually need a cholecystectomy as well.  LFTs are stable to improved.  Continue to monitor periodically.  Acute metabolic encephalopathy versus her usual baseline Does not appear to be encephalopathic.  Her mentation could be baseline due to her psychiatric history.  Continue to monitor.     Bipolar disorder Patient reportedly has history of bipolar disorder and is on multiple medications for this.  Patient apparently has not been taking any of her home medications.  Patient was seen by psychiatry.  She was placed back on her medications.  Patient remains on Depakote, olanzapine as well as fluphenazine.  HIV nonreactive.    Normocytic anemia Baseline is unknown.  Hemoglobin has been stable.  Anemia panel reviewed.  Ferritin 151.  Folate 10.6.  Vitamin B12  1440.  No evidence of overt bleeding.  High potassium level Potassium noted to be high normal on 1/10.  She was taken off of potassium in her IV fluids.  Potassium normal and stable.    Social situation Apparently patient does not have a place to live anymore.  Her apartment is inhabitable.  Discussed with her brother, Kristina Knox.  He was under the impression that the patient was going to be admitted to behavioral health after medical issues had become stable.  No such plan per psychiatry notes.  He is requesting to talk to psychiatry.  I have left a message with Kristina Knox to give the brother a call.  Social worker has been consulted as the patient is currently homeless.  Has psychiatry condition will be a major challenge in her disposition.   DVT Prophylaxis: SCDs Disposition: Management as outlined above.  Will consult social worker due to her social situation. CODE STATUS: Full code   Consultants:   Kristina Knox  General surgery  Procedures:  ERCP Impression:                - Choledocholithiasis with an obstruction was found. Numerous stones proximal to a large impacted stone causing obstruction. 5 cm 10 Fr Stent placed  to provide drainage.  Antimicrobials:   IV Zosyn started 12/05/2019.  Discontinued on 1/11   Subjective: Patient feels about the same.  Denies any complaints.  Specifically no abdominal pain nausea or vomiting.  Objective: Vitals:   12/10/18 0453 12/10/18 1551 12/10/18 2039 12/11/18 0443  BP: 121/68 135/86 (!) 144/94 134/87  Pulse: 79 94 85 95  Resp:  14 14 18 18   Temp: 98.3 F (36.8 C) 98.1 F (36.7 C) 98.2 F (36.8 C) 98.1 F (36.7 C)  TempSrc: Oral Oral    SpO2: 99% 100% 100% 100%  Weight:      Height:        Intake/Output Summary (Last 24 hours) at 12/11/2018 1241 Last data filed at 12/11/2018 0050 Gross per 24 hour  Intake 200 ml  Output -  Net 200 ml   Filed Weights   12/06/18 0648 12/08/18 0851  Weight: 47.9 kg 47.9 kg     Examination:  General appearance: Awake alert.  In no distress.  Distracted. Resp: Clear to auscultation bilaterally.  Normal effort Cardio: S1-S2 is normal regular.  No S3-S4.  No rubs murmurs or bruit Knox: Abdomen is soft.  Nontender nondistended.  Bowel sounds are present normal.  No masses organomegaly Extremities: No edema.  Full range of motion of lower extremities. Neurologic: Alert and oriented x3.  No focal neurological deficits.    Data Reviewed: I have personally reviewed following labs and imaging studies  CBC: Recent Labs  Lab 12/04/18 1523 12/05/18 0430 12/06/18 0352 12/07/18 0406 12/09/18 0525  WBC 10.4 11.5* 8.0 7.1 7.1  NEUTROABS 6.3  --   --   --   --   HGB 9.9* 9.1* 9.2* 9.5* 9.7*  HCT 29.2* 27.4* 28.0* 28.4* 30.0*  MCV 97.7 96.8 99.6 97.9 99.7  PLT 572* 459* 392 369 413*   Basic Metabolic Panel: Recent Labs  Lab 12/04/18 2129  12/06/18 0352 12/07/18 0406 12/08/18 0401 12/09/18 0525 12/10/18 0432 12/11/18 0822  NA  --    < > 142 140 140 139 141 138  K  --    < > 4.2 4.2 5.1 4.5 4.2 3.9  CL  --    < > 110 106 105 101 102 103  CO2  --    < > 25 25 27 30 28 22   GLUCOSE  --    < > 119* 97 103* 86 108* 116*  BUN  --    < > 11 10 17 9 13  23*  CREATININE  --    < > 0.60 0.54 0.62 0.63 0.71 0.57  CALCIUM  --    < > 8.4* 8.6* 8.8* 8.9 9.1 9.0  MG 1.7  --  2.1  --   --   --   --   --    < > = values in this interval not displayed.   GFR: Estimated Creatinine Clearance: 60.1 mL/min (by C-G formula based on SCr of 0.57 mg/dL).  Liver Function Tests: Recent Labs  Lab 12/07/18 0406 12/08/18 0401 12/09/18 0525 12/10/18 0432 12/11/18 0822  AST 149* 106* 93* 88* 69*  ALT 130* 109* 91* 83* 75*  ALKPHOS 1,046* 1,030* 946* 1,011* 924*  BILITOT 5.4* 5.2* 4.3* 4.0* 3.6*  PROT 5.9* 6.7 6.4* 6.7 7.4  ALBUMIN 2.4* 2.7* 2.7* 2.8* 3.1*   Recent Labs  Lab 12/04/18 1702 12/05/18 0430 12/09/18 0525  LIPASE 86* 118* 40   Recent Labs  Lab  12/04/18 1702  AMMONIA 50*   Coagulation Profile: Recent Labs  Lab 12/04/18 1702  INR 0.91    Recent Results (from the past 240 hour(s))  Blood culture (routine x 2)     Status: None   Collection Time: 12/04/18  7:50 PM  Result Value Ref Range Status   Specimen Description   Final    BLOOD RIGHT ANTECUBITAL Performed at [facility], [address], [place], [place] [address]    Special Requests   Final    BOTTLES DRAWN AEROBIC AND ANAEROBIC Blood Culture adequate volume Performed at [facility], [address], [place], [place] [address]    Culture   Final    NO GROWTH 5 DAYS Performed at [facility], [address] [place], [place] [address]    Report Status 12/09/2018 FINAL  Final  Blood culture (routine x 2)     Status: None   Collection Time: 12/04/18  7:50 PM  Result Value Ref Range Status   Specimen Description   Final    BLOOD LEFT ANTECUBITAL Performed at [facility] [place], [address], [place], [place] [address]    Special Requests   Final    BOTTLES DRAWN AEROBIC AND ANAEROBIC Blood Culture adequate volume Performed at [facility], [address] [place], [place] [address]    Culture   Final    NO GROWTH 5 DAYS Performed at [facility], [address], [place], [place] [address]    Report Status 12/09/2018 FINAL  Final         Radiology Studies: No results found.   Scheduled Meds: . divalproex  1,000 mg Oral QHS  . fluPHENAZine  5 mg Oral QHS  . OLANZapine  10 mg Oral QHS   Continuous Infusions:    LOS: 7 days    Osvaldo Shipper, Knox [facility]  If 7PM-7AM, please contact night-coverage [email or web address]   12/11/2018, 12:41 PM

## 2018-12-11 NOTE — Telephone Encounter (Signed)
-----   Message from Unk Lightning, [place] sent at 12/11/2018 10:06 AM EST ----- Regarding: OV and labs Needs follow up with Jess or Dr. Leone Payor in 2-3 weeks. Needs CBC and CMP prior to that day (day before) she will need repeat ERCP scheduled at that visit with Dr. Meridee Score.  Being discharged today.  Thanks-JLL

## 2018-12-11 NOTE — Care Management Important Message (Signed)
Important Message  Patient Details  Name: LINELLE HAYNIE MRN: [id number] Date of Birth: 1961-07-27   Medicare Important Message Given:  Yes    Caren Macadam 12/11/2018, 12:00 PMImportant Message  Patient Details  Name: LUANN ARAIZA MRN: [id number] Date of Birth: 1961/11/24   Medicare Important Message Given:  Yes    Caren Macadam 12/11/2018, 12:00 PM

## 2018-12-12 ENCOUNTER — Telehealth: Payer: Self-pay

## 2018-12-12 LAB — COMPREHENSIVE METABOLIC PANEL
ALT: 59 U/L — ABNORMAL HIGH (ref 0–44)
ANION GAP: 11 (ref 5–15)
AST: 51 U/L — ABNORMAL HIGH (ref 15–41)
Albumin: 3.1 g/dL — ABNORMAL LOW (ref 3.5–5.0)
Alkaline Phosphatase: 827 U/L — ABNORMAL HIGH (ref 38–126)
BUN: 20 mg/dL (ref 6–20)
CO2: 19 mmol/L — ABNORMAL LOW (ref 22–32)
Calcium: 9 mg/dL (ref 8.9–10.3)
Chloride: 106 mmol/L (ref 98–111)
Creatinine, Ser: 0.54 mg/dL (ref 0.44–1.00)
GFR calc Af Amer: >60 mL/min (ref >60)
Glucose, Bld: 106 mg/dL — ABNORMAL HIGH (ref 70–99)
Potassium: 4.3 mmol/L (ref 3.5–5.1)
Sodium: 136 mmol/L (ref 135–145)
Total Bilirubin: 3.5 mg/dL — ABNORMAL HIGH (ref 0.3–1.2)
Total Protein: 7.3 g/dL (ref 6.5–8.1)

## 2018-12-12 NOTE — Progress Notes (Signed)
PROGRESS NOTE    Kristina Knox  ZOX:[id number] DOB: 09/14/1961 DOA: 12/04/2018 PCP: [name], Dibas, MD    Brief Narrative:  58 year old with past medical history relevant for bipolar disorder brought into the emergency department for altered mental status.  Psychiatric screening labs found mixed cholestatic and hepatocellular (predominantly cholestatic) pattern of liver injury and ultrasound showed choledocholithiasis as well as cholecystitis.  Patient was hospitalized.  Gastroenterology was consulted.  Patient underwent ERCP with stent placement.  However the stones were too large to be removed.  Plan is to repeat ERCP in a few weeks time.  Patient was also seen by psychiatry.  Disposition seems to be the main issue now.   Assessment & Plan:   Acute cholecystitis/choledocholithiasis/abnormal LFTs Noted to be jaundice.  LFTs were elevated.  Patient was placed on Zosyn.  Gastroenterology was consulted.  Patient underwent ERCP on 1/10.  Biliary stent was placed however a large stone was found which could not be removed.  LFTs are slowly improving.  Hepatitis panel negative.  Zosyn was discontinued.  Plan is to repeat ERCP in the next 4 to 6 weeks.  This will be arranged by gastroenterology.  She will also eventually need a cholecystectomy which they will facilitate.  LFTs are improving.  Patient remains stable.  Acute metabolic encephalopathy versus her usual baseline Does not appear to be encephalopathic.  Her mentation could be baseline due to her psychiatric history.  Continue to monitor.     Bipolar disorder Patient reportedly has history of bipolar disorder and is on multiple medications for this.  Patient apparently has not been taking any of her home medications.  Patient was seen by psychiatry.  She was placed back on her medications.  Patient remains on Depakote, olanzapine as well as fluphenazine.  HIV nonreactive.    Normocytic anemia Baseline is unknown.  Hemoglobin has been stable.   Anemia panel reviewed.  Ferritin 151.  Folate 10.6.  Vitamin B12 1440.    High potassium level Potassium noted to be high normal on 1/10.  She was taken off of potassium in her IV fluids.  Potassium normal and stable.    Social situation Apparently patient does not have a place to live anymore.  Her apartment is inhabitable.  Discussed with her brother, Kristina Knox.  He was under the impression that the patient was going to be admitted to behavioral health after medical issues had become stable.  No such plan per psychiatry notes.  He is requesting to talk to psychiatry.  Discussed with Dr. Sharma CovertNorman this morning who will speak with patient's brother.  Social worker to also see the patient to help with the disposition.    DVT Prophylaxis: SCDs Disposition: Management as outlined above.  Patient remains medically stable for discharge now.  Disposition is not clear. CODE STATUS: Full code   Consultants:   [facility]  General surgery  Procedures:  ERCP Impression:                - Choledocholithiasis with an obstruction was found. Numerous stones proximal to a large impacted stone causing obstruction. 5 cm 10 Fr Stent placed  to provide drainage.  Antimicrobials:   IV Zosyn started 12/05/2019.  Discontinued on 1/11   Subjective: Patient denies any complaints.  Objective: Vitals:   12/11/18 0443 12/11/18 1329 12/11/18 2043 12/12/18 0431  BP: 134/87 137/77 134/77 135/83  Pulse: 95 83 85 81  Resp: 18 18 18 18   Temp: 98.1 F (36.7 C) 98.3 F (  36.8 C) 98.7 F (37.1 C) 98.7 F (37.1 C)  TempSrc:  Oral Oral Oral  SpO2: 100% 100% 100% 96%  Weight:      Height:       No intake or output data in the 24 hours ending 12/12/18 1241 Filed Weights   12/06/18 0648 12/08/18 0851  Weight: 47.9 kg 47.9 kg    Examination: General appearance: Awake alert.  In no distress.  Distracted Resp: Clear to auscultation bilaterally.  Normal effort Cardio: S1-S2 is normal regular.  No S3-S4.  No  rubs murmurs or bruit GI: Abdomen is soft.  Nontender nondistended.  Bowel sounds are present normal.  No masses organomegaly Extremities: No edema.  Full range of motion of lower extremities. Neurologic: No focal neurological deficits.    Data Reviewed: I have personally reviewed following labs and imaging studies  CBC: Recent Labs  Lab 12/06/18 0352 12/07/18 0406 12/09/18 0525  WBC 8.0 7.1 7.1  HGB 9.2* 9.5* 9.7*  HCT 28.0* 28.4* 30.0*  MCV 99.6 97.9 99.7  PLT 392 369 413*   Basic Metabolic Panel: Recent Labs  Lab 12/06/18 0352  12/08/18 0401 12/09/18 0525 12/10/18 0432 12/11/18 0822 12/12/18 0348  NA 142   < > 140 139 141 138 136  K 4.2   < > 5.1 4.5 4.2 3.9 4.3  CL 110   < > 105 101 102 103 106  CO2 25   < > 27 30 28 22  19*  GLUCOSE 119*   < > 103* 86 108* 116* 106*  BUN 11   < > 17 9 13  23* 20  CREATININE 0.60   < > 0.62 0.63 0.71 0.57 0.54  CALCIUM 8.4*   < > 8.8* 8.9 9.1 9.0 9.0  MG 2.1  --   --   --   --   --   --    < > = values in this interval not displayed.   GFR: Estimated Creatinine Clearance: 60.1 mL/min (by C-G formula based on SCr of 0.54 mg/dL).  Liver Function Tests: Recent Labs  Lab 12/08/18 0401 12/09/18 0525 12/10/18 0432 12/11/18 0822 12/12/18 0348  AST 106* 93* 88* 69* 51*  ALT 109* 91* 83* 75* 59*  ALKPHOS 1,030* 946* 1,011* 924* 827*  BILITOT 5.2* 4.3* 4.0* 3.6* 3.5*  PROT 6.7 6.4* 6.7 7.4 7.3  ALBUMIN 2.7* 2.7* 2.8* 3.1* 3.1*   Recent Labs  Lab 12/09/18 0525  LIPASE 40     Recent Results (from the past 240 hour(s))  Blood culture (routine x 2)     Status: None   Collection Time: 12/04/18  7:50 PM  Result Value Ref Range Status   Specimen Description   Final    BLOOD RIGHT ANTECUBITAL Performed at [facility], [address] [place], [place] [address]    Special Requests   Final    BOTTLES DRAWN AEROBIC AND ANAEROBIC Blood Culture adequate volume Performed at [facility], [address], [place], [place] [address]    Culture   Final    NO GROWTH 5 DAYS Performed at [facility], [address] [place], [place] [address]    Report Status 12/09/2018 FINAL  Final  Blood culture (routine x 2)     Status: None   Collection Time: 12/04/18  7:50 PM  Result Value Ref Range Status   Specimen Description   Final    BLOOD LEFT ANTECUBITAL Performed at [facility], [address] [place], [place] [address]    Special Requests   Final    BOTTLES DRAWN AEROBIC AND ANAEROBIC Blood Culture adequate volume Performed at [facility], [address] [place], [place] [address]    Culture   Final    NO GROWTH 5 DAYS Performed at [facility], [address] [place], [place] [address]    Report Status 12/09/2018 FINAL  Final         Radiology Studies: No results found.   Scheduled Meds: . divalproex  1,000 mg Oral QHS  . fluPHENAZine  5 mg Oral QHS  . OLANZapine  10 mg Oral QHS   Continuous Infusions:    LOS: 8 days    Osvaldo Shipper, MD [facility]  If 7PM-7AM, please contact night-coverage [email or web address]   12/12/2018, 12:41 PM

## 2018-12-12 NOTE — Telephone Encounter (Signed)
-----   Message from Lemar Lofty., MD sent at 12/12/2018  5:46 AM EST ----- Regarding: RE: Patient for you Baldo Ash, Thank you for the referral. Suleika Donavan, please set up a follow up ERCP in approximately 4-6 weeks. She should follow up with Baldo Ash or the PAs before that though. All the best. Gabe ----- Message ----- From: Iva Boop, MD Sent: 12/08/2018   1:26 PM EST To: Lemar Lofty., MD Subject: Patient for you                                Take a look at her ERCP  She is full of stones  I stented her but she is going to need an elective lithotripsy w/ Spyglass  Thanks  Baldo Ash

## 2018-12-12 NOTE — Discharge Instructions (Signed)
For your behavioral health needs, you are advised to follow up with an outpatient provider.  You may be eligible for ACT Team services, which would include more frequent visits with your provider, as well as in-home services.  The following providers offer ACT Team services.  Contact them at your earliest opportunity to ask about enrolling in their program:       [facility]      [address]      [place], [place] [address]      [phone]       [facility]      [address]      [place], [place] [address]      [phone]       [facility] ACT Team      [address] [facility] [address]      [address]      [place], [place]  [address]      [phone]       [facility]      [address]      [place], [place] [address]      [phone]  

## 2018-12-12 NOTE — Telephone Encounter (Signed)
Appt made to see Kristina Knox on 12/29/18 at 830 am.  Pt was already notified.

## 2018-12-12 NOTE — Consult Note (Addendum)
Spoke to patient's brother who is her POA. Explained that patient has been evaluated on 1/8 and 1/9. Patient's brother disagrees with psychiatric clearance and reports that he will fax information to the hospital about her psychiatric history. He was provided with the fax number to send this information. She currently does not meet criteria at this time for inpatient psychiatric hospitalization. She denies SI, HI or AVH. She is taking her medications as prescribed and her psychiatric condition has improved since admission. She is more organized in thought process and appropriate in behavior.   Was contacted by [facility] psychologist, Jocelyn Lamer, PhD (office: [phone], office cell: [phone]). She asked hypothetical questions about discharge planning for psychiatric patients and is interested in the patient establishing care with the ACT team. She was not provided with any information about the patient due to not having consent from the patient to provide information to her. Information about ACT team services will be provided in her discharge instructions.   Please inform psychiatry if further assistance is needed with patient's care.    Juanetta Beets, DO 12/12/18 11:51 AM
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
## 2018-12-13 LAB — COMPREHENSIVE METABOLIC PANEL
ALT: 56 U/L — ABNORMAL HIGH (ref 0–44)
AST: 61 U/L — ABNORMAL HIGH (ref 15–41)
Albumin: 3 g/dL — ABNORMAL LOW (ref 3.5–5.0)
Alkaline Phosphatase: 756 U/L — ABNORMAL HIGH (ref 38–126)
Anion gap: 12 (ref 5–15)
BILIRUBIN TOTAL: 3.1 mg/dL — AB (ref 0.3–1.2)
BUN: 18 mg/dL (ref 6–20)
CO2: 23 mmol/L (ref 22–32)
Calcium: 9 mg/dL (ref 8.9–10.3)
Chloride: 102 mmol/L (ref 98–111)
Creatinine, Ser: 0.53 mg/dL (ref 0.44–1.00)
GFR calc Af Amer: >60 mL/min (ref >60)
GFR calc non Af Amer: >60 mL/min (ref >60)
Glucose, Bld: 91 mg/dL (ref 70–99)
Potassium: 4.3 mmol/L (ref 3.5–5.1)
Sodium: 137 mmol/L (ref 135–145)
Total Protein: 6.9 g/dL (ref 6.5–8.1)

## 2018-12-13 MED ORDER — OLANZAPINE 10 MG PO TABS: 10.0000 mg | ORAL_TABLET | Freq: Every day | ORAL | 0 refills | Status: DC

## 2018-12-13 MED ORDER — FLUPHENAZINE HCL 5 MG PO TABS: 5.0000 mg | ORAL_TABLET | Freq: Every day | ORAL | 0 refills | Status: DC

## 2018-12-13 MED ORDER — ACETAMINOPHEN 325 MG PO TABS: 650.0000 mg | ORAL_TABLET | Freq: Four times a day (QID) | ORAL | Status: DC | PRN

## 2018-12-13 MED ORDER — DIVALPROEX SODIUM 500 MG PO DR TAB: 1000.0000 mg | DELAYED_RELEASE_TABLET | Freq: Every day | ORAL | 0 refills | Status: DC

## 2018-12-13 MED ORDER — POLYETHYLENE GLYCOL 3350 17 G PO PACK: 17.0000 g | PACK | Freq: Every day | ORAL | 0 refills | Status: DC | PRN

## 2018-12-13 NOTE — Progress Notes (Signed)
PT Cancellation Note  Patient Details Name: TOMEEKA BORTZ MRN: [id number] DOB: 1961-10-21   Cancelled Treatment:    Reason Eval/Treat Not Completed: Other (comment)(pt was evaluated 12/04/18, PT signed off because pt was independent with mobility. RN reports pt continues to be  independent with mobility, including ambulating in the halls. PT not needed, signing off. )  Tamala Ser PT 12/13/2018  Acute Rehabilitation Services Pager [phone] Office [phone]

## 2018-12-13 NOTE — Progress Notes (Signed)
Discharge instructions gone over with pt and copy given to her. Verbalizes understanding. Taxi voucher given to pt. Reminded pt to notify her brother of discharge. She had stated earlier that "she wasn't interested in what he had to say". Melton Alar, RN

## 2018-12-13 NOTE — Progress Notes (Addendum)
CSW met with patient via bedside to discuss discharge plans. CSW inform patient of shelter located in the [place] area, [facility], and patient voiced understanding with discharge plans. Patient seemed happy to be leaving the hospital and asked CSW when she would be leaving/ how she would be getting there- CSW informed patient if she was not familiar with the bus route a taxi voucher could be provided. Patient thanked CSW and asked if she could eat her lunch first. CSW updated RN and MD.   CSW received verbal consent to contact brother, Clare Gandy, regarding disposition plans. CSW informed brother of discharge plans to shelter, [facility], in [place], [place]. Brother voiced his concerns/ frustrations with patient discharging from the hospital with no permnant living arrangements. Brother informed CSW that "psychiatrist friend" from [facility] was wanting to come by to evaluate patient. CSW informed brother that if patient was wanting to leave hospital we could not stop her from leaving at this time. Brother voiced frustration with CSW and hospital throughout patients duration of stay.   CSW discussed patient with social work Surveyor, quantity who supported patient plans to discharge at this time. CSW will provide patient with taxi voucher and shelter resources upon discharge. CSW will continue to follow for discharge needs.   Kingsley Spittle, [place]  [phone]

## 2018-12-13 NOTE — Progress Notes (Signed)
RN had conversation with patient during morning assessment. She was alert and oriented to person, place, time and situation. Answered questions appropriately. Wondering "what the plan is for leaving". Awaiting Md for rounds. Voices no complaints. Melton Alar, RN

## 2018-12-13 NOTE — Progress Notes (Signed)
Per RN, DR. Jocelyn Lamer (psychiatry friend) was coming to visit patient around 8PM tonight- CSW questioned if patient wanted to wait until then to meet with her/ then discharge. Patient stated "not if I can go now!". CSW providing patient with taxi voucher to shelter.    Stacy Gardner, LCSW Clinical Social Worker  [employer]  [phone]

## 2018-12-13 NOTE — Discharge Summary (Addendum)
Physician Discharge Summary  Kristina Knox KGU:[id number] DOB: 12-29-1960 DOA: 12/04/2018  PCP: Lujean Amel, MD  Admit date: 12/04/2018 Discharge date: 12/13/2018  Time spent: 45 minutes  Recommendations for Outpatient Follow-up:  1. Given scripts of Depakote fluphenazine and other antipsychotics this admission 2. Will need outpatient follow-up with [facility] for interval cholecystectomy if possible 3. 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  Psychiatric screening labs found mixed cholestatic and hepatocellular (predominantly cholestatic) pattern of liver injury and ultrasound showed choledocholithiasis as well as cholecystitis.  Patient was hospitalized.  Gastroenterology was consulted.  Patient underwent ERCP with stent placement.  However the stones were too large to be removed.  Plan is to repeat ERCP in a few weeks time.  Patient was also seen by psychiatry.  Disposition seems to be the main issue now.   Hospital Course:  Acute cholecystitis/choledocholithiasis/abnormal LFTs Noted to be jaundice.  LFTs were elevated.  Patient was placed on Zosyn.  Gastroenterology was consulted.  Patient underwent ERCP on 1/10.    Biliary stent was placed at the time-LFTs trended downwards-large stone noted and ERCP is to be considered again in the outpatient setting in addition to possible cholecystectomy  only  lab abnormality at present is alk phos  Psychiatric disorder predominant bipolar Not encephalopathic felt to be her baseline Continue to monitor.     Bipolar disorder Patient reportedly has history of bipolar disorder and is on multiple medications for this.  Patient apparently has not been taking any of her home medications.  Patient was seen by psychiatry multiple times this admission-patient was last seen 1/8-1/9 and requested that we talked central regional psychiatrist Pam Smith-patient was stabilized from our perspective and will follow up with Dr. love in [facility] who has seen her in the past  I had a long discussion prior to discharge with her brother about plan of care and he understands the difficulties in terms of placement he is checked on the apartment that she is supposed to be going to and it is still not ready so she may need to stay in a temporary housing situation before going to another facility or her apartment  Normocytic anemia Baseline is unknown.  Hemoglobin has been stable.  Anemia panel reviewed.  Ferritin 151.  Folate 10.6.  Vitamin B12 1440.    High potassium level Potassium noted to be high normal on 1/10.  She was taken off of potassium in her IV fluids.  Potassium normal and stable.    Consultants:   [facility]  General surgery  Procedures:  ERCP Impression:  - Choledocholithiasis with an obstruction was found. Numerous stones proximal to a large impacted stone causing obstruction. 5 cm 10 Fr Stent placed to provide drainage.  Antimicrobials:   IV Zosyn started 12/05/2019.  Discontinued on 1/11 Discharge Exam: Vitals:   12/12/18 2117 12/13/18 0514  BP: 135/79 116/75  Pulse: 91 98  Resp:  18  Temp: 97.9 F (36.6 C) 98.4 F (36.9 C)  SpO2: 100% 100%    General: Awake alert pleasant but scattered Cardiovascular: S1-S2 no murmur rub or gallop regular rate rhythm Respiratory: Clinically clear no added sound Abdomen soft  nontender no rebound no guarding Frail Power 5/5  Discharge Instructions   Discharge Instructions    Diet - low sodium heart healthy   Complete by:  As directed    Discharge instructions   Complete by:  As directed    Please follow-up with your regular psychiatrist we have given you prescriptions for all of your psychiatric medications on discharge she will need to obtain them and you will probably need to get to shelter on 12/10 that you can return to your apartment   Increase activity slowly   Complete by:  As directed      Allergies as of 12/13/2018      Reactions   Codeine Nausea And Vomiting      Medication List    STOP taking these medications   ibuprofen 200 MG tablet Commonly known as:  ADVIL,MOTRIN   naproxen sodium 220 MG tablet Commonly known as:  ALEVE   POTASSIUM PO     TAKE these medications   acetaminophen 325 MG tablet Commonly known as:  TYLENOL Take 2 tablets (650 mg total) by mouth every 6 (six) hours as needed for mild pain (or Fever >/= 101).   divalproex 500 MG DR tablet Commonly known as:  DEPAKOTE Take 2 tablets (1,000 mg total) by mouth at bedtime.   FISH OIL PO Take 1 tablet by mouth daily.   fluPHENAZine 5 MG tablet Commonly known as:  PROLIXIN Take 1 tablet (5 mg total) by mouth at bedtime.   Melatonin 5 MG Tabs Take 5-10 mg by mouth at bedtime.   OLANZapine 10 MG tablet Commonly known as:  ZYPREXA Take 1 tablet (10 mg total) by mouth at bedtime.   polyethylene glycol packet Commonly known as:  MIRALAX / GLYCOLAX Take 17 g by mouth daily as needed for mild constipation.   VISION FORMULA PO Take 1 tablet by mouth daily.   VITAMIN C PO Take 1 tablet by mouth daily.      Allergies  Allergen Reactions  . Codeine Nausea And Vomiting   Follow-up Information    Zehr, Laban Emperor, PA-C Follow up.   Specialty:  Gastroenterology Why:  On 12/29/2018 at 8:30AM Contact information: [place]  [phone]            The results of significant diagnostics from this hospitalization (including imaging, microbiology, ancillary and laboratory) are listed below for reference.    Significant Diagnostic Studies: Dg Chest 2 View  Result Date: 12/04/2018 CLINICAL DATA:  Medical clearance EXAM: CHEST - 2 VIEW COMPARISON:  None. FINDINGS: Lungs are clear. The heart size and pulmonary vascularity are normal. No adenopathy. There is midthoracic levoscoliosis. IMPRESSION: No edema or consolidation. Electronically Signed   By: Lowella Grip III M.D.   On: 12/04/2018 14:35   Dg Ercp Biliary & Pancreatic Ducts  Result Date: 12/08/2018 CLINICAL DATA:  58 year old female with a history of choledocholithiasis/cholelithiasis EXAM: ERCP TECHNIQUE: Multiple spot images obtained with the fluoroscopic device and submitted for interpretation post-procedure. FLUOROSCOPY TIME:  3 minutes 2 seconds COMPARISON:  Ultrasound 12/04/2018 FINDINGS: Limited intraoperative fluoroscopic spot images during ERCP. Initial image demonstrates endoscope projecting over the upper abdomen, with then cannulation of the ampulla and retrograde infusion of contrast  partially opacifying the extrahepatic biliary ducts with a safety wire in place. Multiple rounded filling defects are identified within the extrahepatic ducts, as well as the common hepatic duct extending towards the right and left hepatic ducts. Last image demonstrates plastic biliary stent in position with multiple retained filling defects. IMPRESSION: Limited images during ERCP demonstrates choledocholithiasis involving common bile duct, common hepatic duct, and the left and right hepatic ducts at the confluence, with treatment by placement of plastic biliary stent. Please refer to the dictated operative report for full details of intraoperative findings and procedure. Electronically Signed   By: Corrie Mckusick D.O.   On: 12/08/2018 10:30   US Abdomen Limited  Ruq  Result Date: 12/04/2018 CLINICAL DATA:  Elevated LFTs.  Confusion.  Altered mental status. EXAM: ULTRASOUND ABDOMEN LIMITED RIGHT UPPER QUADRANT COMPARISON:  None. FINDINGS: Gallbladder: There are multiple gallstones, with sludge. Gallbladder wall thickness is increased, 3.9 mm, with suspected pericholecystic fluid. No definite sonographic Murphy's sign. Common bile duct: Diameter: Common bile duct is abnormally enlarged up to 16 mm. There is a stone within the dilated common bile duct measuring 13 mm in largest dimension. Liver: Intrahepatic biliary ductal dilatation is present. Echogenicity is increased. Portal vein is patent on color Doppler imaging with normal direction of blood flow towards the liver. IMPRESSION: Multiple gallstones with sludge. Common bile duct gallstones with intrahepatic and extrahepatic biliary ductal dilatation, up to 16 mm CBD. Pericholecystic fluid, and gallbladder wall thickening without reported sonographic Murphy's sign. Concern raised for acute cholecystitis. These results were called by telephone at the time of interpretation on 12/04/2018 at 7:04 pm to Dr. Marda Stalker , who verbally acknowledged these results. Electronically Signed   By: Staci Righter M.D.   On: 12/04/2018 19:08    Microbiology: Recent Results (from the past 240 hour(s))  Blood culture (routine x 2)     Status: None   Collection Time: 12/04/18  7:50 PM  Result Value Ref Range Status   Specimen Description   Final    BLOOD RIGHT ANTECUBITAL Performed at [facility], [place] [address], [place], [place] [address]    Special Requests   Final    BOTTLES DRAWN AEROBIC AND ANAEROBIC Blood Culture adequate volume Performed at [place] [address], [place], [place] [address]    Culture   Final    NO GROWTH 5 DAYS Performed at [facility], [place] [address] [place], [place] [address]    Report Status 12/09/2018 FINAL  Final  Blood culture  (routine x 2)     Status: None   Collection Time: 12/04/18  7:50 PM  Result Value Ref Range Status   Specimen Description   Final    BLOOD LEFT ANTECUBITAL Performed at [place] [address], [place], [place] [address]    Special Requests   Final    BOTTLES DRAWN AEROBIC AND ANAEROBIC Blood Culture adequate volume Performed at [facility] [address], [place], [place] [address]    Culture   Final    NO GROWTH 5 DAYS Performed at [facility], [place] [address] [place], [place] [address]    Report Status 12/09/2018 FINAL  Final     Labs: Basic Metabolic Panel: Recent Labs  Lab 12/09/18 0525 12/10/18 0432 12/11/18 0822 12/12/18 0348 12/13/18 0459  NA 139 141 138 136 137  K 4.5 4.2 3.9 4.3 4.3  CL 101 102 103 106 102  CO2  '30 28 22 ' 19* 23  GLUCOSE 86 108* 116* 106* 91  BUN 9 13 23* 20 18  CREATININE 0.63 0.71 0.57 0.54 0.53  CALCIUM 8.9 9.1 9.0 9.0 9.0   Liver Function Tests: Recent Labs  Lab 12/09/18 0525 12/10/18 0432 12/11/18 0822 12/12/18 0348 12/13/18 0459  AST 93* 88* 69* 51* 61*  ALT 91* 83* 75* 59* 56*  ALKPHOS 946* 1,011* 924* 827* 756*  BILITOT 4.3* 4.0* 3.6* 3.5* 3.1*  PROT 6.4* 6.7 7.4 7.3 6.9  ALBUMIN 2.7* 2.8* 3.1* 3.1* 3.0*   Recent Labs  Lab 12/09/18 0525  LIPASE 40   No results for input(s): AMMONIA in the last 168 hours. CBC: Recent Labs  Lab 12/07/18 0406 12/09/18 0525  WBC 7.1 7.1  HGB 9.5* 9.7*  HCT 28.4* 30.0*  MCV 97.9 99.7  PLT 369 413*   Cardiac Enzymes: No results for input(s): CKTOTAL, CKMB, CKMBINDEX, TROPONINI in the last 168 hours. BNP: BNP (last 3 results) No results for input(s): BNP in the last 8760 hours.  ProBNP (last 3 results) No results for input(s): PROBNP in the last 8760 hours.  CBG: No results for input(s): GLUCAP in the last 168 hours.     Signed:  Nita Sells MD   [facility] 12/13/2018, 10:52 AM `dc`

## 2018-12-15 ENCOUNTER — Emergency Department (HOSPITAL_COMMUNITY)
Admission: EM | Admit: 2018-12-15 | Discharge: 2018-12-15 | Disposition: A | Discharge status: Home / Self Care | Payer: Medicare Other | Attending: Emergency Medicine | Admitting: Emergency Medicine

## 2018-12-15 ENCOUNTER — Encounter (HOSPITAL_COMMUNITY): Payer: Self-pay

## 2018-12-15 ENCOUNTER — Other Ambulatory Visit: Payer: Self-pay

## 2018-12-15 DIAGNOSIS — Z760 Encounter for issue of repeat prescription: Secondary | ICD-10-CM | POA: Diagnosis not present

## 2018-12-15 DIAGNOSIS — F17200 Nicotine dependence, unspecified, uncomplicated: Secondary | ICD-10-CM | POA: Diagnosis not present

## 2018-12-15 DIAGNOSIS — Z79899 Other long term (current) drug therapy: Secondary | ICD-10-CM | POA: Diagnosis not present

## 2018-12-15 MED ORDER — FLUPHENAZINE HCL 5 MG PO TABS: 5.0000 mg | ORAL_TABLET | Freq: Every day | ORAL | 0 refills | Status: DC

## 2018-12-15 MED ORDER — OLANZAPINE 10 MG PO TABS: 10.0000 mg | ORAL_TABLET | Freq: Every day | ORAL | 0 refills | Status: DC

## 2018-12-15 MED ORDER — DIVALPROEX SODIUM 500 MG PO DR TAB: 1000.0000 mg | DELAYED_RELEASE_TABLET | Freq: Every day | ORAL | 0 refills | Status: DC

## 2018-12-15 MED ORDER — NAPROXEN 500 MG PO TABS
500.0000 mg | ORAL_TABLET | Freq: Once | ORAL | Status: AC
  Administered 2018-12-15: 500 mg via ORAL
  Filled 2018-12-15: qty 1

## 2018-12-15 NOTE — ED Provider Notes (Signed)
[facility]-EMERGENCY DEPT Provider Note: Lowella Dell, MD, FACEP  CSN: [id number] MRN: [id number] ARRIVAL: 12/15/18 at 0144 ROOM: WHALB/WHALB   CHIEF COMPLAINT  Medication Refill   HISTORY OF PRESENT ILLNESS  12/15/18 5:03 AM Kristina Knox is a 58 y.o. female who was discharged home from the hospital 2 days ago after being admitted for gallbladder disease.  She underwent an ERCP with stent placement.  She also has a history of psychiatric issues and was seen by psychiatry.  The discharge summary states she was to be discharged on Depakote, Zyprexa and ibuprofen.  She is requesting refills of these medications because she did not take her prescriptions with her.  She is requesting that they be sent to [facility].   Past Medical History:  Diagnosis Date  . Mood disorder (H[facility])   . Seasonal allergies     Past Surgical History:  Procedure Laterality Date  . BILIARY STENT PLACEMENT N/A 12/08/2018   Procedure: BILIARY STENT PLACEMENT;  Surgeon: Iva Boop, MD;  Location: [facility] ENDOSCOPY;  Service: Endoscopy;  Laterality: N/A;  . ERCP N/A 12/08/2018   Procedure: ENDOSCOPIC RETROGRADE CHOLANGIOPANCREATOGRAPHY (ERCP);  Surgeon: Iva Boop, MD;  Location: [facility];  Service: Endoscopy;  Laterality: N/A;  . SPHINCTEROTOMY  12/08/2018   Procedure: SPHINCTEROTOMY;  Surgeon: Iva Boop, MD;  Location: [facility] ENDOSCOPY;  Service: Endoscopy;;  balloon sweep    History reviewed. No pertinent family history.  Social History   Tobacco Use  . Smoking status: Current Every Day Smoker  . Smokeless tobacco: Never Used  Substance Use Topics  . Alcohol use: No  . Drug use: No    Prior to Admission medications   Medication Sig Start Date End Date Taking? Authorizing Provider  acetaminophen (TYLENOL) 325 MG tablet Take 2 tablets (650 mg total) by mouth every 6 (six) hours as needed for mild pain (or Fever >/= 101). 12/13/18   Rhetta Mura, MD  Ascorbic Acid  (VITAMIN C PO) Take 1 tablet by mouth daily.    [provider]  divalproex (DEPAKOTE) 500 MG DR tablet Take 2 tablets (1,000 mg total) by mouth at bedtime. 12/13/18   Rhetta Mura, MD  fluPHENAZine (PROLIXIN) 5 MG tablet Take 1 tablet (5 mg total) by mouth at bedtime. 12/13/18   Rhetta Mura, MD  Melatonin 5 MG TABS Take 5-10 mg by mouth at bedtime.    [provider]  Multiple Vitamins-Minerals (VISION FORMULA PO) Take 1 tablet by mouth daily.    [provider]  OLANZapine (ZYPREXA) 10 MG tablet Take 1 tablet (10 mg total) by mouth at bedtime. 12/13/18   Rhetta Mura, MD  Omega-3 Fatty Acids (FISH OIL PO) Take 1 tablet by mouth daily.    [provider]  polyethylene glycol (MIRALAX / GLYCOLAX) packet Take 17 g by mouth daily as needed for mild constipation. 12/13/18   Rhetta Mura, MD    Allergies Codeine   REVIEW OF SYSTEMS  Negative except as noted here or in the History of Present Illness.   PHYSICAL EXAMINATION  Initial Vital Signs Blood pressure (!) 157/91, pulse (!) 104, temperature 97.7 F (36.5 C), temperature source Oral, resp. rate 18, SpO2 98 %.  Examination General: Well-developed, well-nourished female in no acute distress; appearance consistent with age of record HENT: normocephalic; atraumatic Eyes: Normal appearance Neck: supple Heart: regular rate and rhythm Lungs: clear to auscultation bilaterally Abdomen: soft; nondistended Extremities: No deformity; full range of motion Neurologic: Awake, alert and  oriented; motor function intact in all extremities and symmetric; no facial droop Skin: Warm and dry Psychiatric: Normal mood and affect; rambling, pressured speech but pleasant and cooperative   RESULTS  Summary of this visit's results, reviewed by myself:   EKG Interpretation  Date/Time:    Ventricular Rate:    PR Interval:    QRS Duration:   QT Interval:    QTC Calculation:   R  Axis:     Text Interpretation:        Laboratory Studies: No results found for this or any previous visit (from the past 24 hour(s)). Imaging Studies: No results found.  ED COURSE and MDM  Nursing notes and initial vitals signs, including pulse oximetry, reviewed.  Vitals:   12/15/18 0157 12/15/18 0513  BP: (!) 157/91 135/79  Pulse: (!) 104 97  Resp: 18 17  Temp: 97.7 F (36.5 C) 98 F (36.7 C)  TempSrc: Oral Oral  SpO2: 98% 100%   Patient's presentation is consistent with her previous history of mania.  We will refill her prescriptions as these are noted in her discharge summary.  PROCEDURES    ED DIAGNOSES     ICD-10-CM   1. Medication refill Z76.0        Linnie Delgrande, Jonny RuizJohn, MD 12/15/18 [phone]

## 2018-12-15 NOTE — ED Triage Notes (Signed)
Pt requesting medication refills of depakote, zyprexa, and ibuprofen. She wants them sent to [facility]. Pt is rambling and states that she was released 2 days ago from [facility]. She denies pain or SI/HI. Pt has filled a belongings bag with handouts and masks from the front desk.

## 2018-12-15 NOTE — ED Notes (Signed)
Bed: WHALB Expected date:  Expected time:  Means of arrival:  Comments: 

## 2018-12-29 ENCOUNTER — Telehealth: Payer: Self-pay | Admitting: Internal Medicine

## 2018-12-29 ENCOUNTER — Ambulatory Visit: Payer: Medicare Other | Admitting: Gastroenterology

## 2018-12-29 NOTE — Telephone Encounter (Signed)
Pt brother would like for a cal back from the nurse he wanted to ask some questions about the upcoming procd. ERCP that will be with  Dr Meridee Score.

## 2018-12-29 NOTE — Telephone Encounter (Signed)
I spoke with the patient's brother Kristina Knox, her court ordered guardian.  He is in [place].  She has been [facility] to a [facility].  All question about ERCP and possible upcoming procedures answered.  Patient has been rescheduled for 01/11/19.  He is hoping she will be released from facility at that point.  He will speak with the inpatient psych facility and if she is having any worrisome symptoms he will call back to move up appts. He will call back with any additional questions.

## 2019-01-10 ENCOUNTER — Other Ambulatory Visit (HOSPITAL_COMMUNITY): Payer: 59 | Attending: Psychiatry

## 2019-01-11 ENCOUNTER — Ambulatory Visit (INDEPENDENT_AMBULATORY_CARE_PROVIDER_SITE_OTHER): Payer: 59 | Admitting: Nurse Practitioner

## 2019-01-11 ENCOUNTER — Encounter: Payer: Self-pay | Admitting: Nurse Practitioner

## 2019-01-11 VITALS — BP 108/60 | HR 88 | Ht 67.0 in | Wt 117.1 lb

## 2019-01-11 DIAGNOSIS — K8050 Calculus of bile duct without cholangitis or cholecystitis without obstruction: Secondary | ICD-10-CM

## 2019-01-11 NOTE — Patient Instructions (Signed)
If you are age 56 or older, your body mass index should be between 23-30. Your Body mass index is 18.34 kg/m. If this is out of the aforementioned range listed, please consider follow up with your Primary Care Provider.  If you are age 2 or younger, your body mass index should be between 19-25. Your Body mass index is 18.34 kg/m. If this is out of the aformentioned range listed, please consider follow up with your Primary Care Provider.   We will be contacting you regarding ERCP procedure.  Thank you for choosing me and [facility].   Willette Cluster, NP

## 2019-01-16 ENCOUNTER — Telehealth (HOSPITAL_COMMUNITY): Payer: Self-pay | Admitting: Professional

## 2019-01-17 NOTE — Progress Notes (Signed)
Patient came with guardian to office for hospital follow-up.  While signing in, patient's guardian realized that we have recently become out of network for patient's insurance.  Patient has choledocholithiasis, she is status post ERCP 12/08/2018 with stent placement to bile duct due to a large obstructing stone.  Patient has multiple stones throughout her bile duct and cystic duct.  Plan was for follow-up ERCP with lithotripsy.   Patient was not seen for a visit today.  She did wait in the exam room with her guardian while we gave him the names of several gastroenterologist to might be able to perform the ERCP.  Patient looks fine, she has no GI complaints.  No abdominal pain.  We gave the guardian the names of several gastroenterologist associated with [facility] and [facility].  He spent over an hour on the phone with insurance company, they said that none of the given names were in network for the patient.. They also said that the ERCP would not be covered, not even at a higher rate, if we performed the procedure.  It does appear that Dr. Jeani Hawking is in network.  We sent Dr. Elnoria Howard a message through EMR regarding this case.  Patient's guardian will call Dr. Haywood Pao office to schedule an appointment

## 2019-01-18 ENCOUNTER — Telehealth: Payer: Self-pay | Admitting: Internal Medicine

## 2019-01-18 NOTE — Telephone Encounter (Signed)
Pt will be seeing Dr. Octaviano Glow at [facility].  Pt's brother requested medical records faxed to  Fax: [phone]

## 2019-02-23 ENCOUNTER — Telehealth: Payer: Self-pay | Admitting: Nurse Practitioner

## 2019-02-23 NOTE — Telephone Encounter (Signed)
Left voice mail inquiring if patient had gotten in to see Dr. Elnoria Howard or someone about getting procedures done( ERCP , stent removal and stone extraction). Left our office number

## 2019-03-01 NOTE — Telephone Encounter (Signed)
Kristina Knox returned your call.

## 2019-03-05 NOTE — Telephone Encounter (Signed)
I found this in CareEverywhere  Progress Notes - documented in this encounter Elease Etienne, MD - 03/01/2019 12:30 PM EDT Formatting of this note might be different from the original. Dear Dr. Octaviano Glow   Thank you for asking [place] to see your patient Kristina Knox for an outpatient consultation to evaluate choledocholithiasis. We had intended on setting her up for a procedure directly, but she wanted to meet me prior to that. We will get her setup for an ERCP ASAP. Please see further details below. -Darius  Elease Etienne, MD Assistant Professor  Department of Internal Medicine Section of Gastroenterology

## 2019-03-05 NOTE — Telephone Encounter (Signed)
Beth, please check with guardian Felicity Pellegrini and make sure they saw Dr. Elnoria Howard.

## 2019-07-25 IMAGING — US US ABDOMEN LIMITED
1 series · 13 of 25 positions shown · non-contrast
Comparison: None.

CLINICAL DATA: 56-year-old female with abnormal LFTs.

EXAM:
ULTRASOUND ABDOMEN LIMITED RIGHT UPPER QUADRANT

[Series 1: us abdomen limited · 0.22mm/px · 60 acquisitions, 13 frames shown]
[im 1/60]
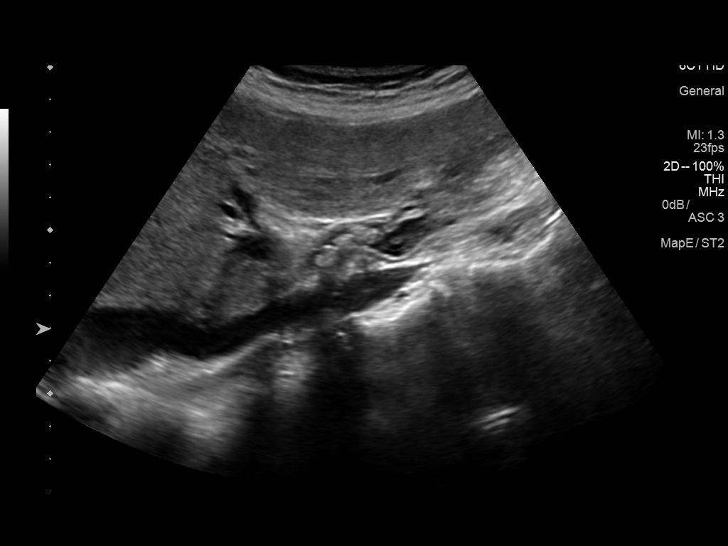
[im 5/60]
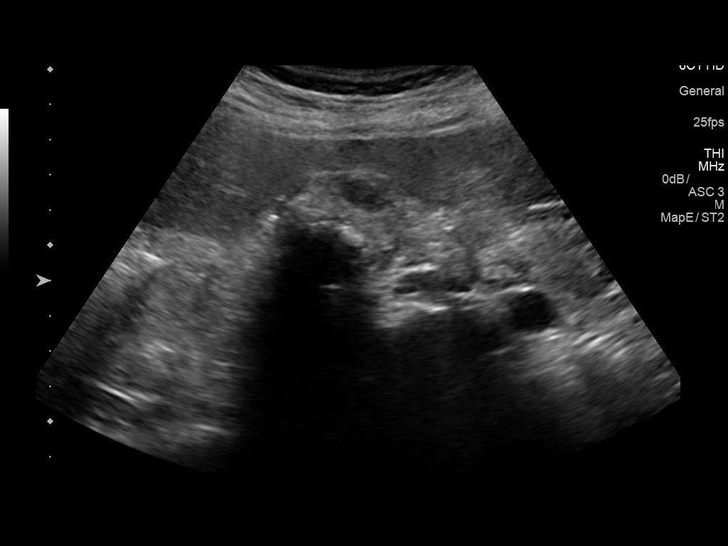
[im 10/60]
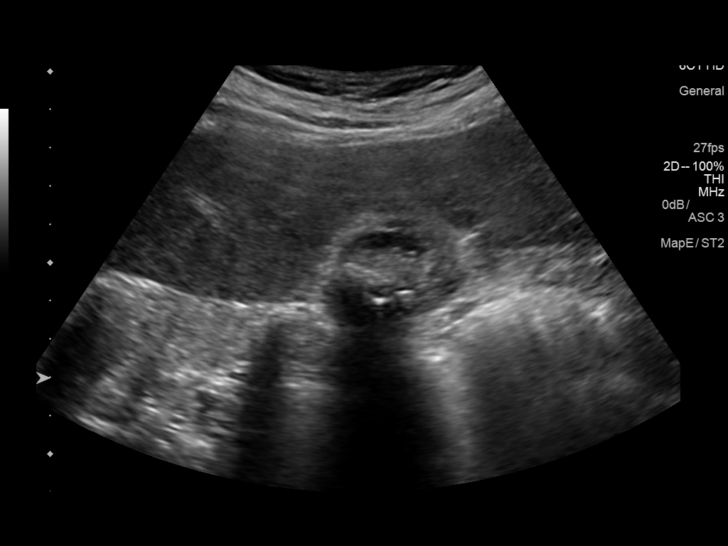
[im 15/60]
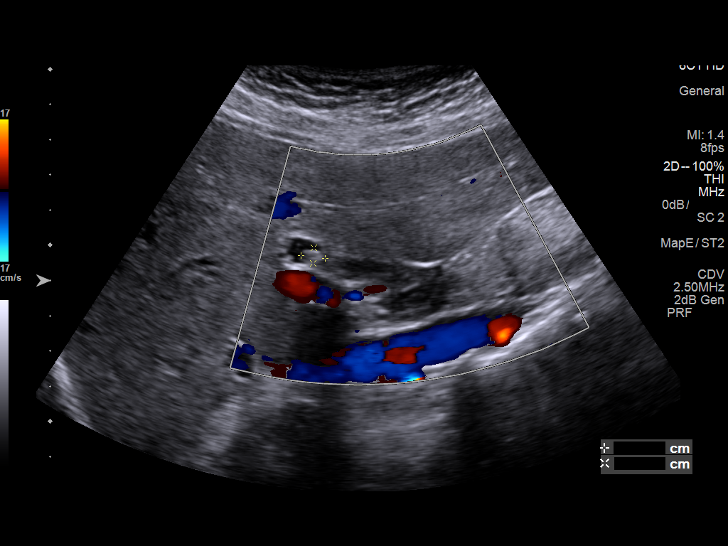
[im 20/60]
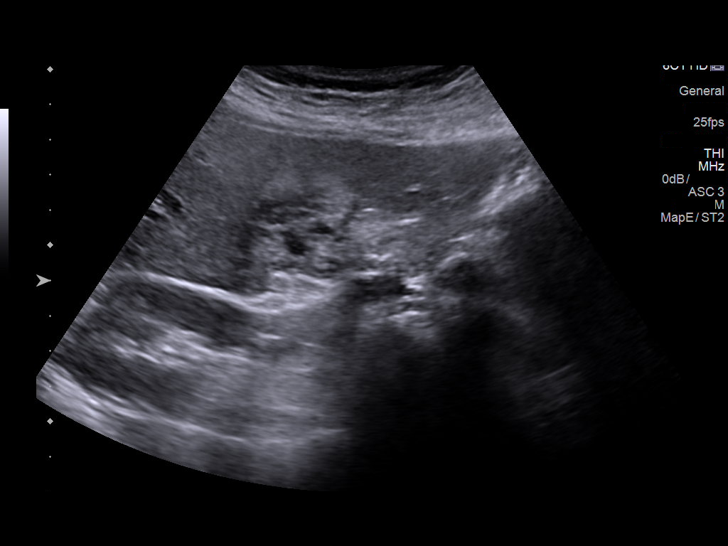
[im 25/60]
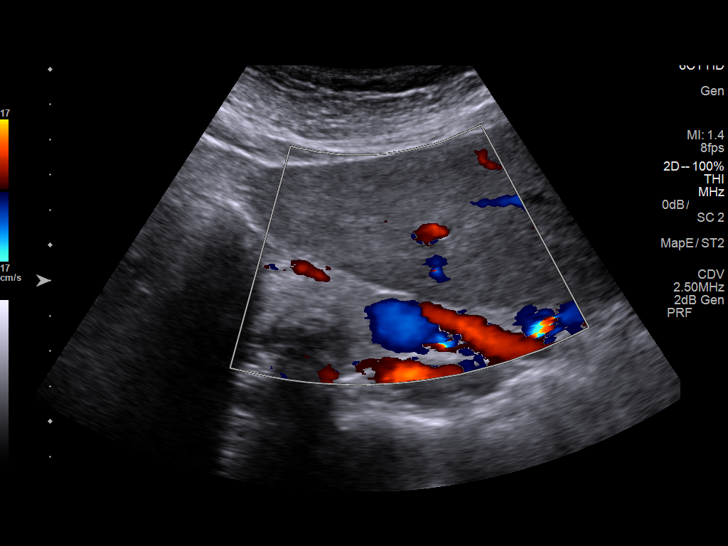
[im 30/60]
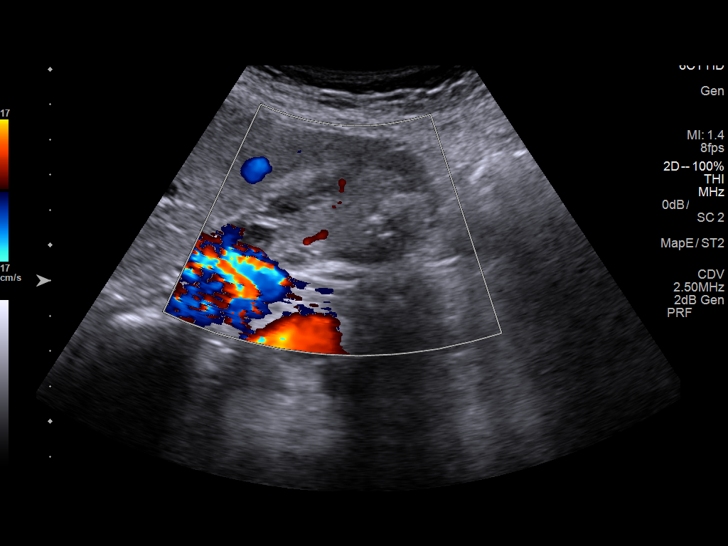
[im 35/60]
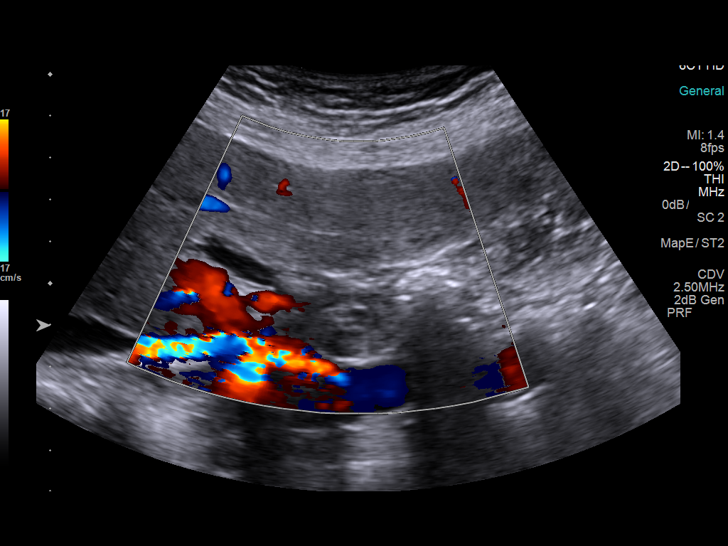
[im 40/60]
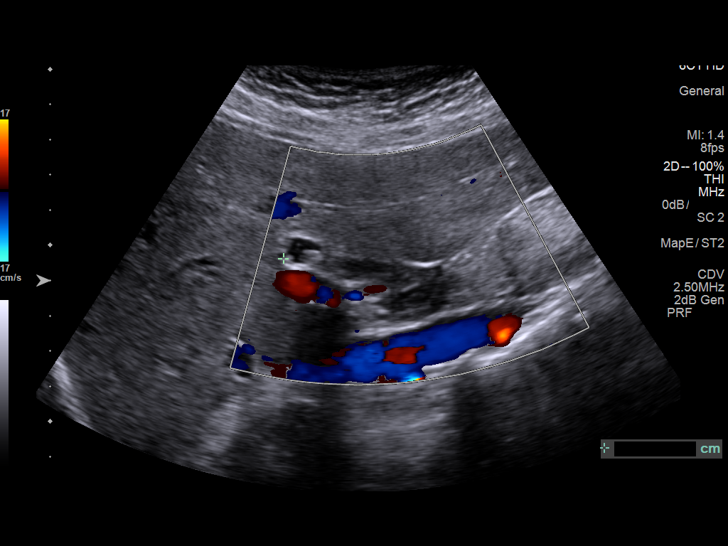
[im 45/60]
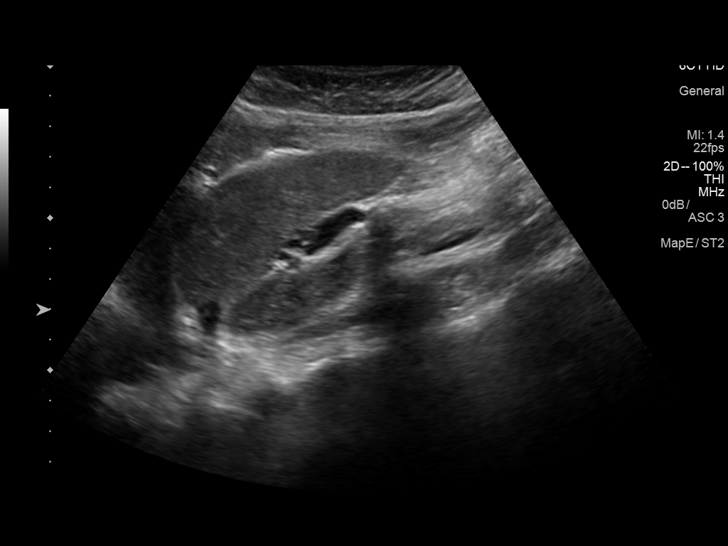
[im 50/60]
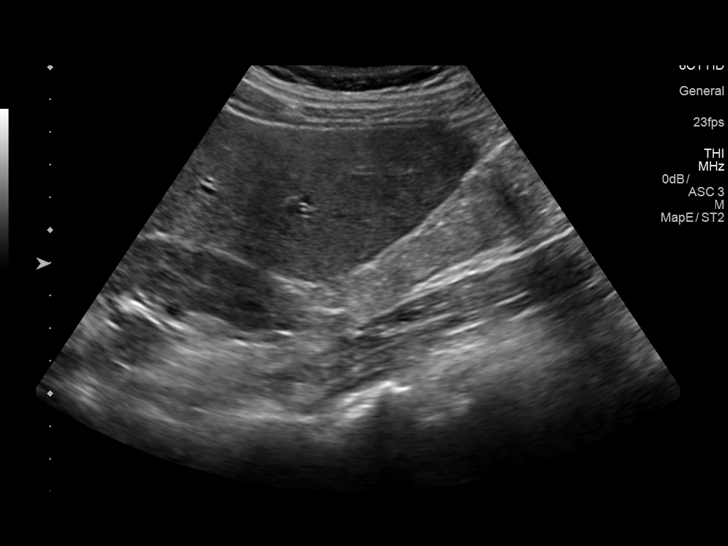
[im 55/60]
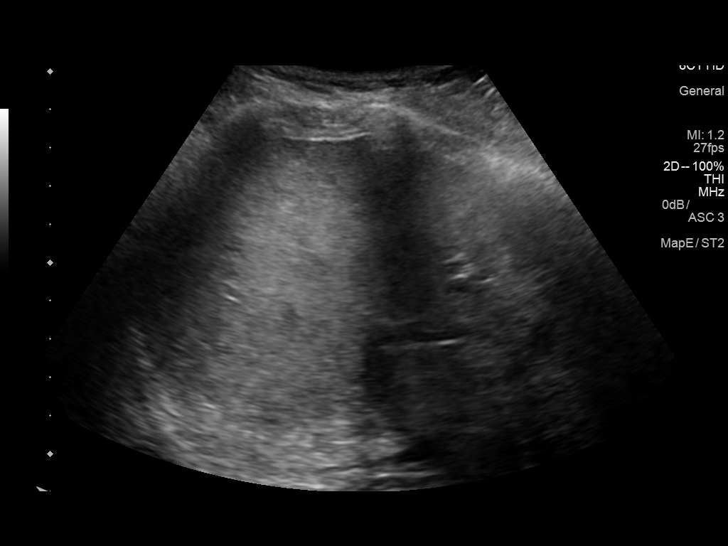
[im 60/60]
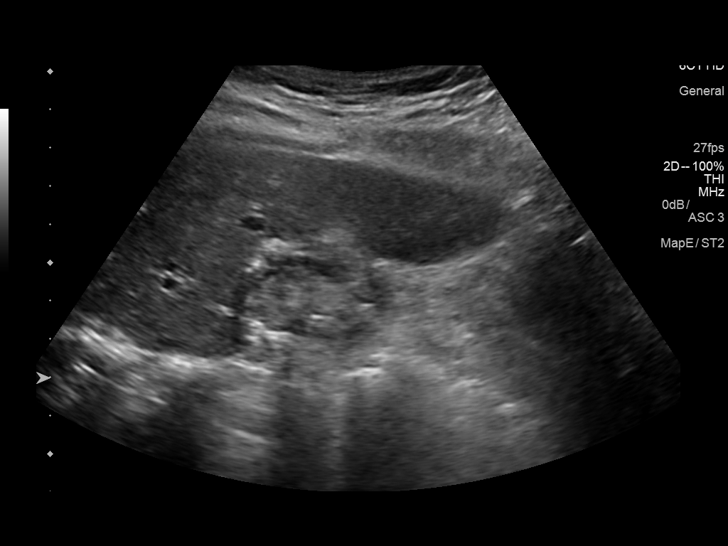

[13 of 25 positions shown; findings below may reference images not displayed]

FINDINGS: Gallbladder:

Echogenic an intermittently shadowing material throughout the
visible gallbladder lumen (image 10). Associated gallbladder wall
thickening up to 5 mm. However, no pericholecystic fluid and no
sonographic Murphy sign elicited. Questionable internal vascularity
in some of the tumefactive echogenic areas such as on image 31.

Common bile duct:

Abnormal echogenic stones or material within the duct (image 16). Up
to 15 mm shadowing stone in the distal duct near the pancreas (image
22), and possible 23 mm shadowing stone or lesion at the area of the
pancreatic head (image 25).

Liver:

No discrete liver lesion. Portal vein is patent on color Doppler
imaging with normal direction of blood flow towards the liver (image
50). Liver echogenicity is at the upper limits of normal. No
intrahepatic biliary ductal dilatation is evident.
IMPRESSION: 1. Abnormal gallbladder, common bile duct, and pancreatic head.
Recommend follow-up Abdomen MRCP and MRI without and with contrast.
2. Differential considerations include: Cholelithiasis and
choledocholithiasis with tumefactive gallbladder sludge, versus
stones superimposed on gallbladder tumor.
3. These results will be called to the ordering clinician or
representative by the Radiologist Assistant, and communication
documented in the PACS or zVision Dashboard.

## 2019-12-04 IMAGING — US US ABDOMEN LIMITED
1 series · 14 of 25 positions shown · non-contrast
Comparison: None.

CLINICAL DATA: Elevated LFTs.  Confusion.  Altered mental status.

EXAM:
ULTRASOUND ABDOMEN LIMITED RIGHT UPPER QUADRANT

[Series 1: us abdomen limited · 14 of 85 slices shown]
[im 1/85]
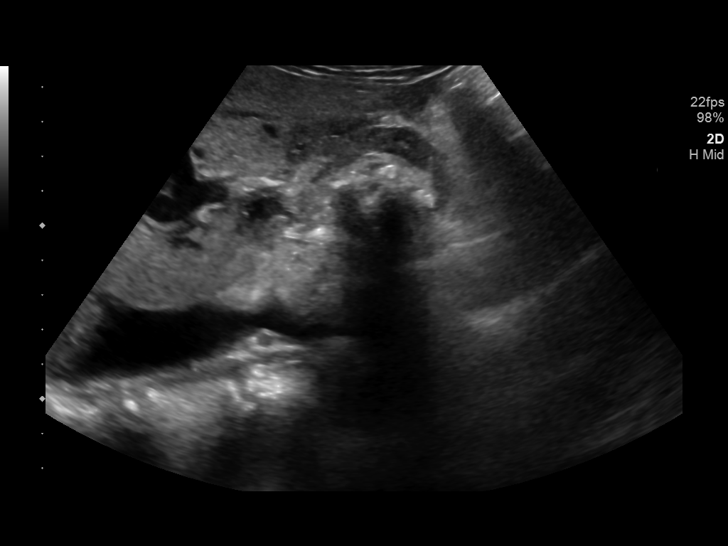
[im 8/85]
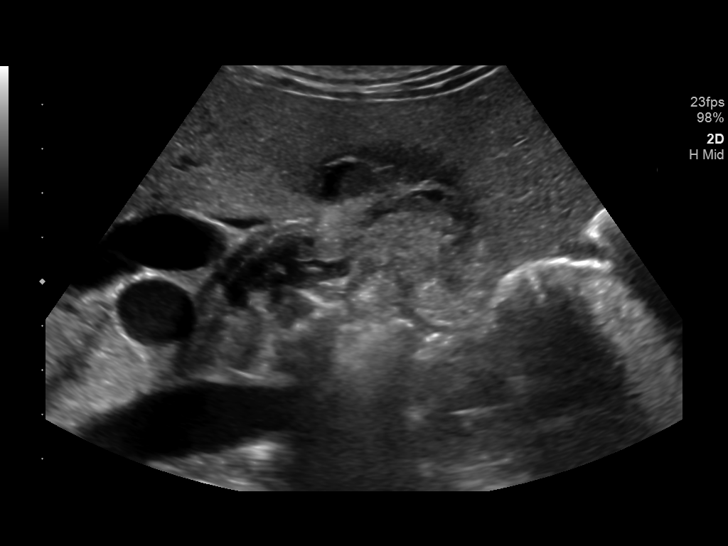
[im 15/85]
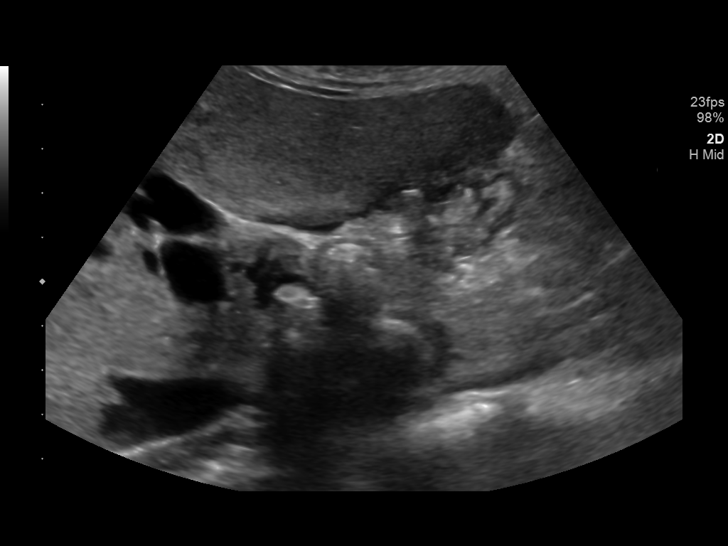
[im 22/85]
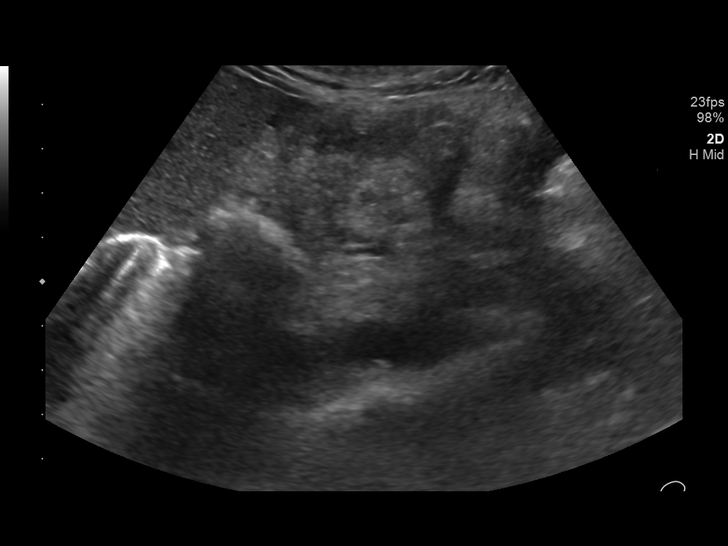
[im 29/85]
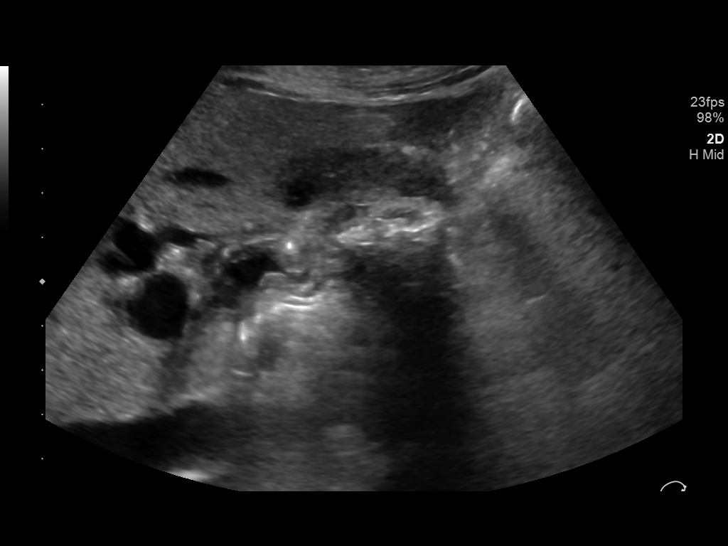
[im 32/85]
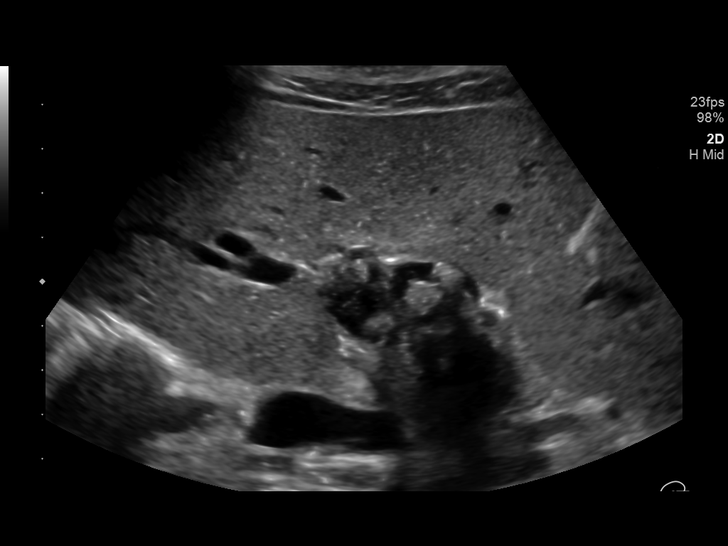
[im 39/85]
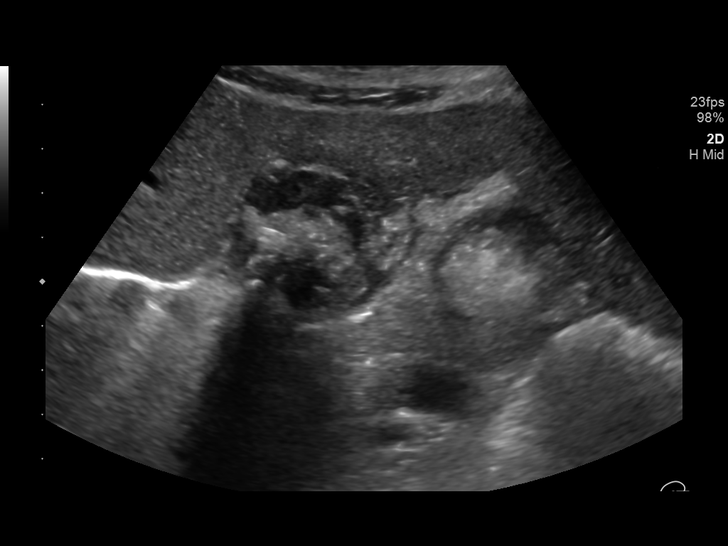
[im 46/85]
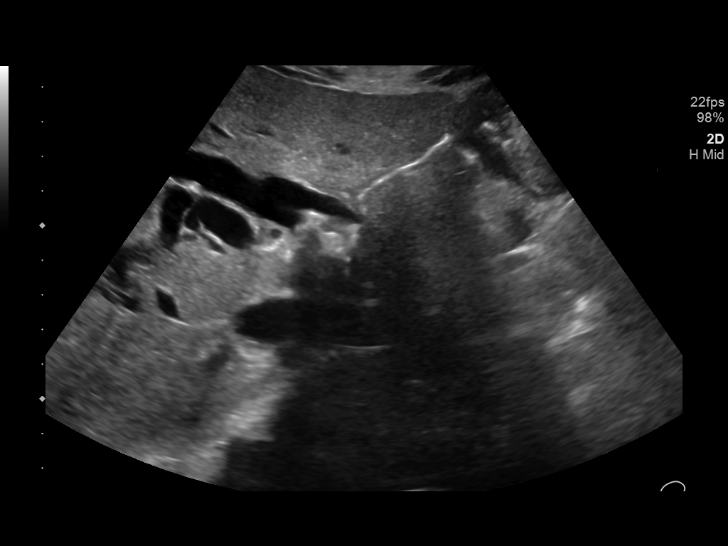
[im 53/85]
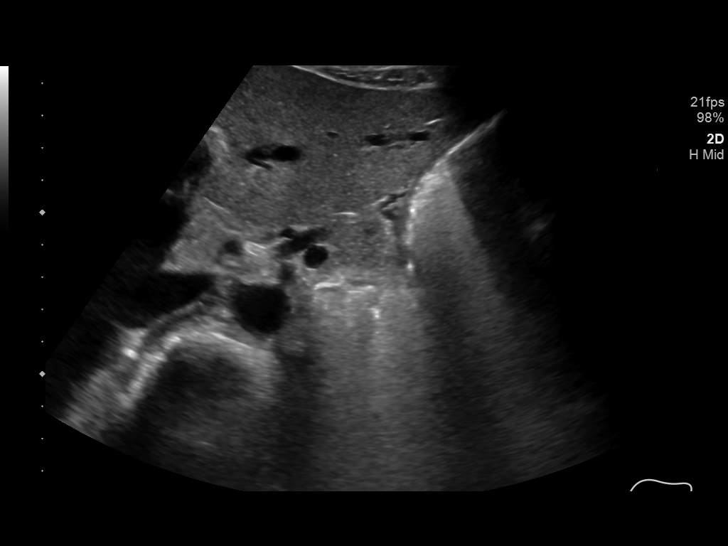
[im 57/85]
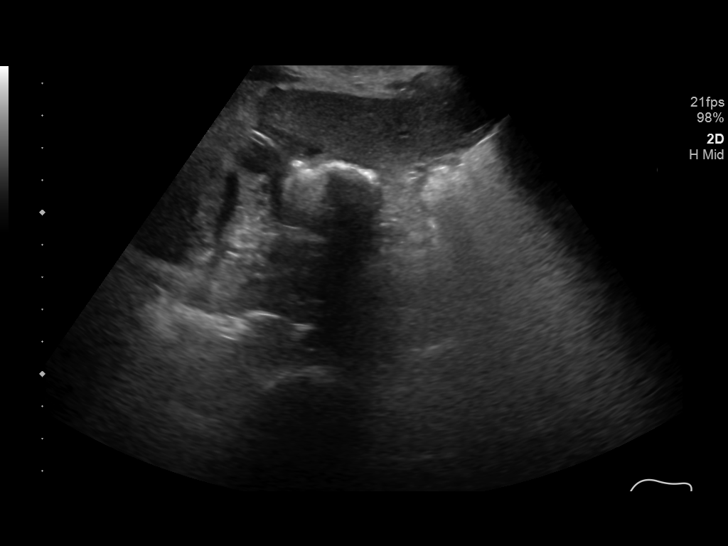
[im 64/85]
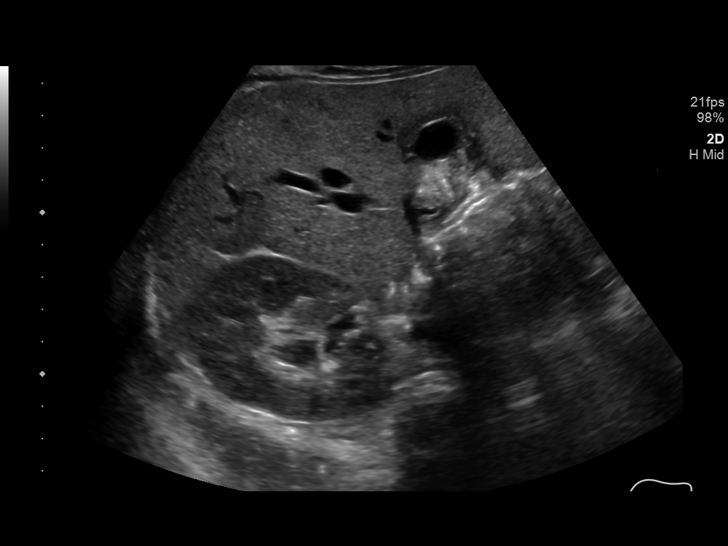
[im 71/85]
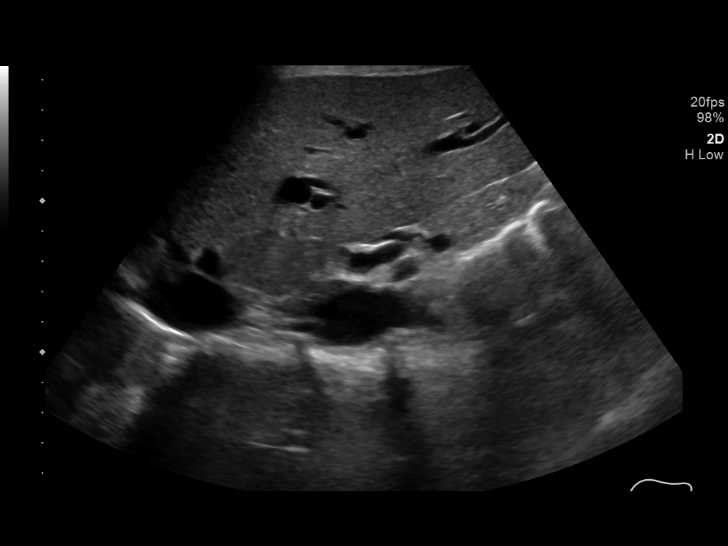
[im 78/85]
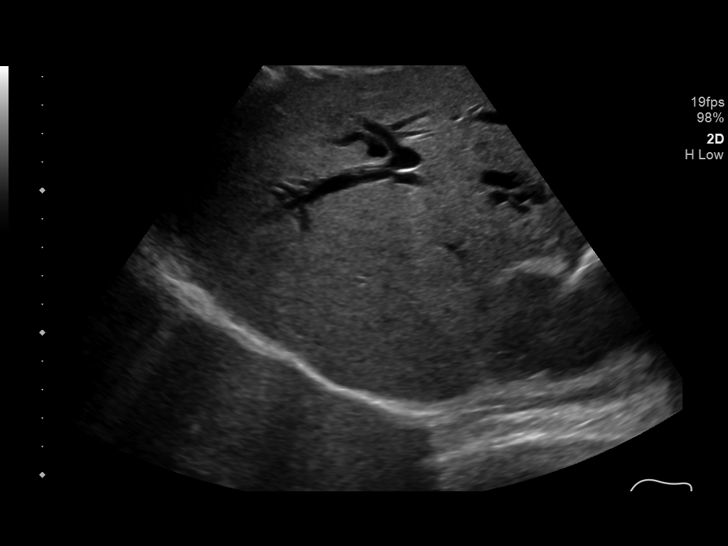
[im 85/85]
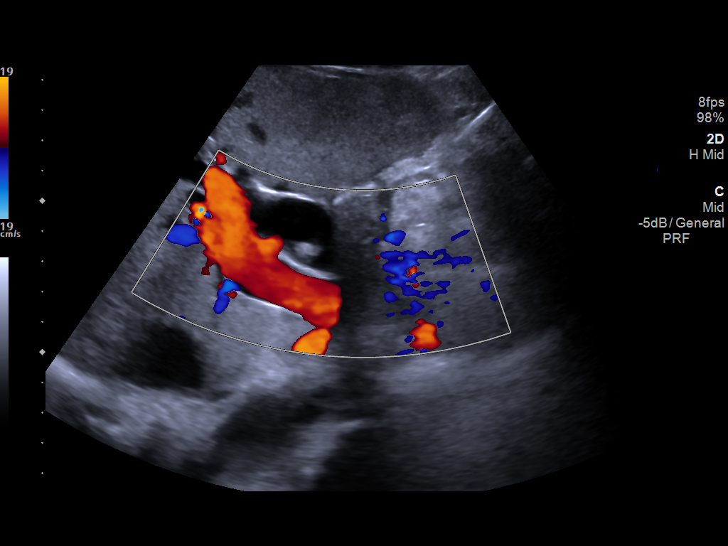

[14 of 25 positions shown; findings below may reference images not displayed]

FINDINGS: Gallbladder:

There are multiple gallstones, with sludge. Gallbladder wall
thickness is increased, 3.9 mm, with suspected pericholecystic
fluid. No definite sonographic Murphy's sign.

Common bile duct:

Diameter: Common bile duct is abnormally enlarged up to 16 mm. There
is a stone within the dilated common bile duct measuring 13 mm in
largest dimension.

Liver:

Intrahepatic biliary ductal dilatation is present. Echogenicity is
increased. Portal vein is patent on color Doppler imaging with
normal direction of blood flow towards the liver.
IMPRESSION: Multiple gallstones with sludge. Common bile duct gallstones with
intrahepatic and extrahepatic biliary ductal dilatation, up to 16 mm
CBD. Pericholecystic fluid, and gallbladder wall thickening without
reported sonographic Murphy's sign. Concern raised for acute
cholecystitis.

These results were called by telephone at the time of interpretation
on 12/04/2018 at [DATE] to Dr. TAMIKA TROST , who verbally
acknowledged these results.

## 2020-02-11 ENCOUNTER — Other Ambulatory Visit: Payer: Self-pay | Admitting: Internal Medicine

## 2020-02-11 DIAGNOSIS — Z1231 Encounter for screening mammogram for malignant neoplasm of breast: Secondary | ICD-10-CM

## 2020-02-13 ENCOUNTER — Other Ambulatory Visit: Payer: Self-pay | Admitting: Internal Medicine

## 2020-02-13 DIAGNOSIS — R945 Abnormal results of liver function studies: Secondary | ICD-10-CM

## 2020-03-12 ENCOUNTER — Other Ambulatory Visit: Payer: Self-pay

## 2020-03-12 ENCOUNTER — Ambulatory Visit
Admission: RE | Admit: 2020-03-12 | Discharge: 2020-03-12 | Disposition: A | Discharge status: Home / Self Care | Payer: Medicare Other | Source: Ambulatory Visit | Attending: Internal Medicine | Admitting: Internal Medicine

## 2020-03-12 DIAGNOSIS — Z1231 Encounter for screening mammogram for malignant neoplasm of breast: Secondary | ICD-10-CM

## 2020-03-12 DIAGNOSIS — R945 Abnormal results of liver function studies: Secondary | ICD-10-CM

## 2020-08-13 ENCOUNTER — Encounter (HOSPITAL_BASED_OUTPATIENT_CLINIC_OR_DEPARTMENT_OTHER): Payer: Medicare Other | Attending: Physician Assistant | Admitting: Physician Assistant

## 2021-03-12 IMAGING — US US ABDOMEN LIMITED
1 series · 14 of 25 positions shown · non-contrast
Comparison: 12/04/2018

CLINICAL DATA: Abnormal LFTs, obstructive liver disease with
history of gallstones, post cholecystectomy and ERCP

EXAM:
ULTRASOUND ABDOMEN LIMITED RIGHT UPPER QUADRANT

[Series 1: us abdomen limited · 0.19mm/px · 14 of 30 slices shown]
[im 1/30]
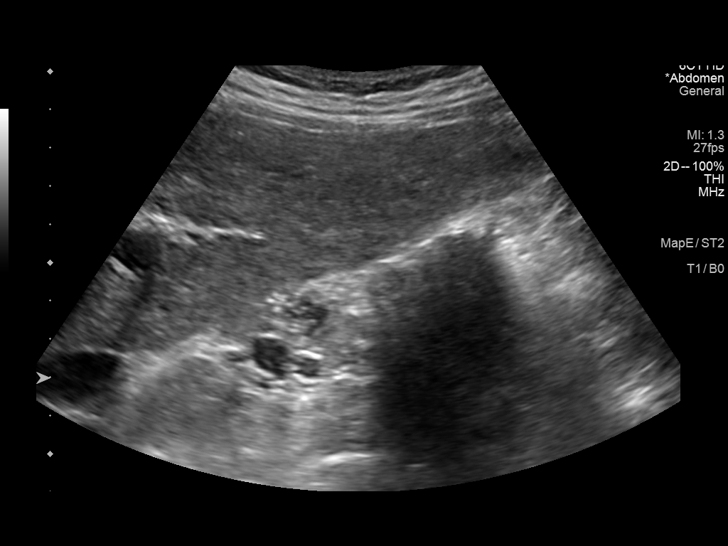
[im 3/30]
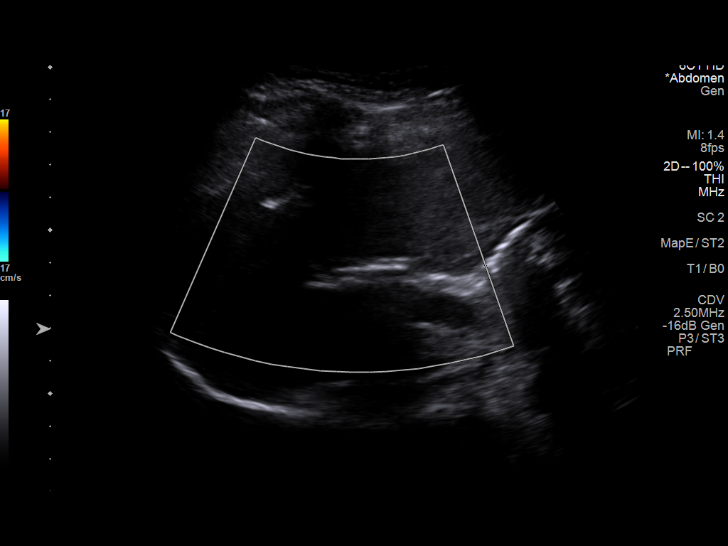
[im 5/30]
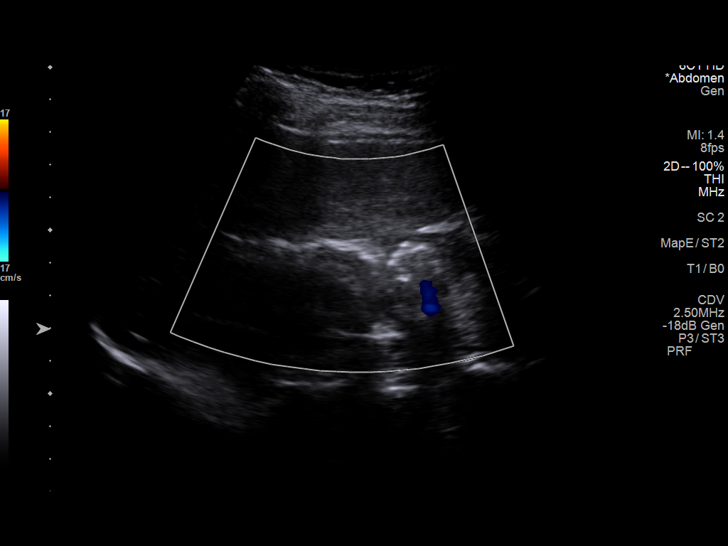
[im 8/30]
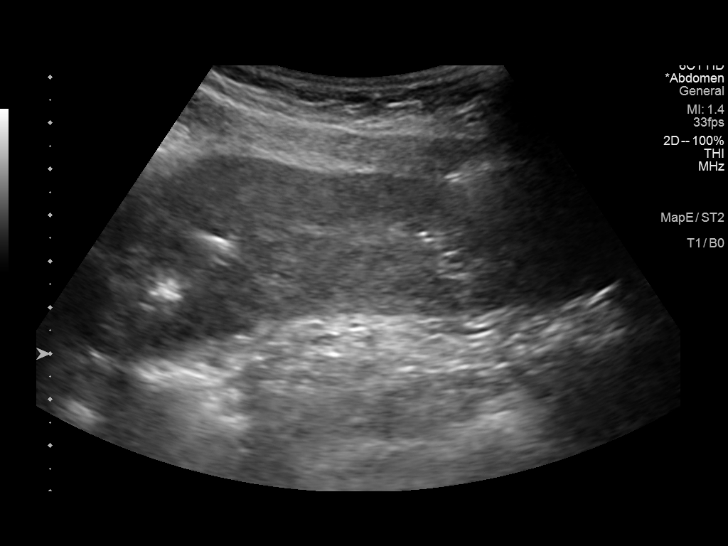
[im 10/30]
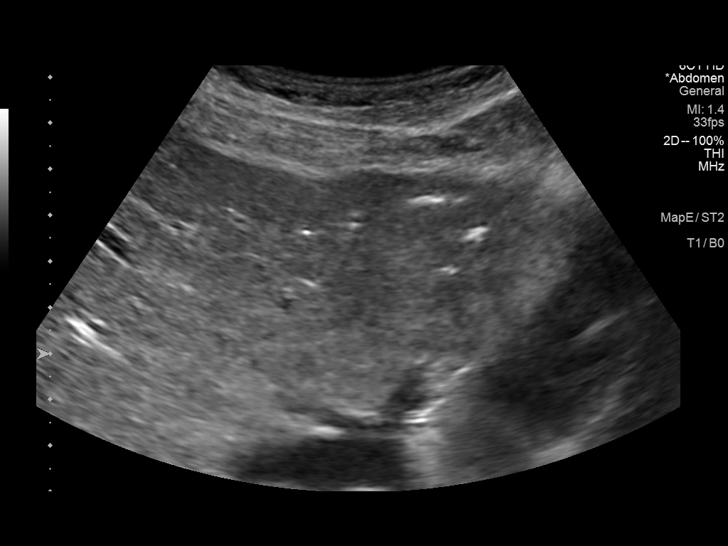
[im 11/30]
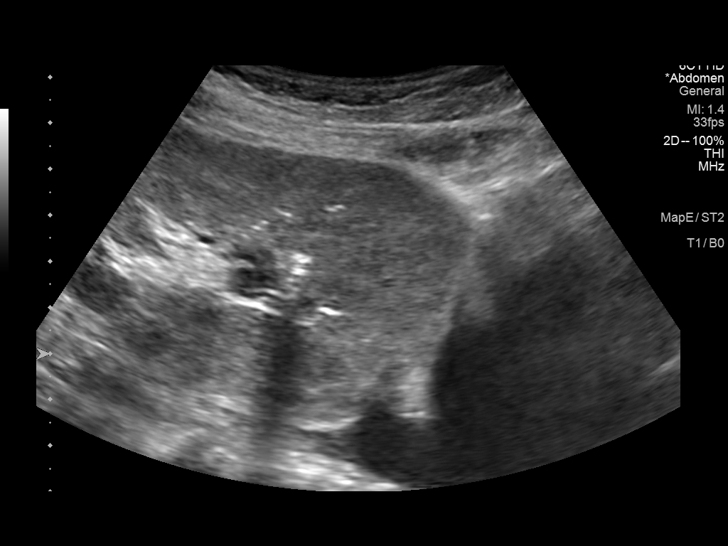
[im 14/30]
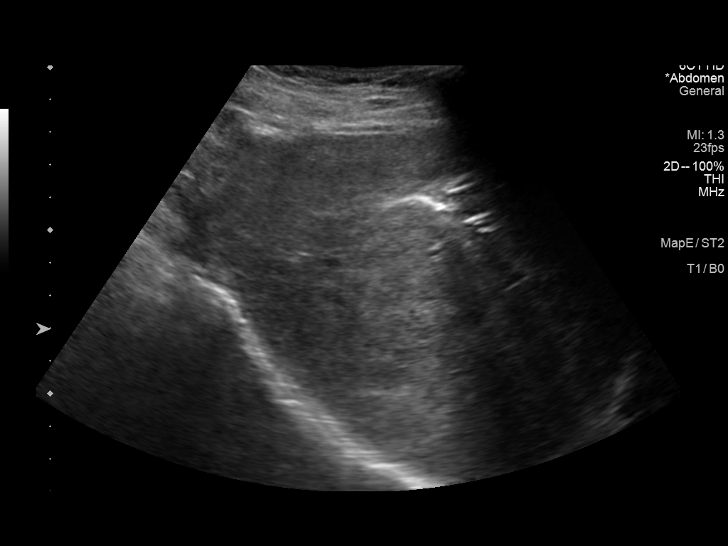
[im 16/30]
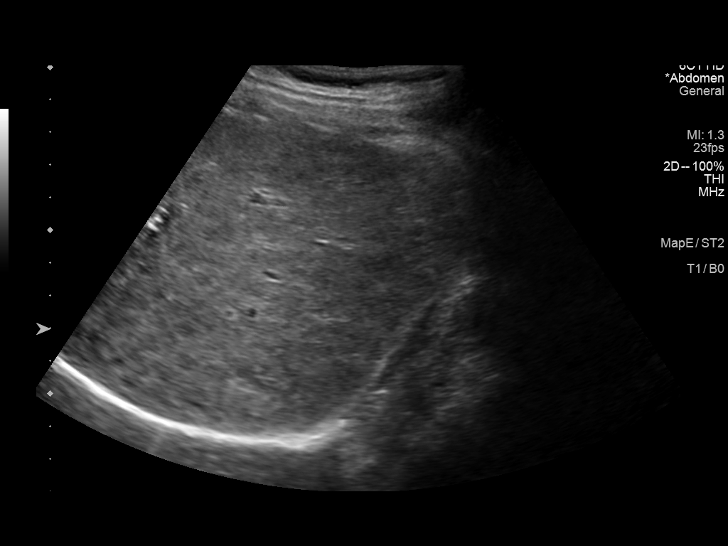
[im 19/30]
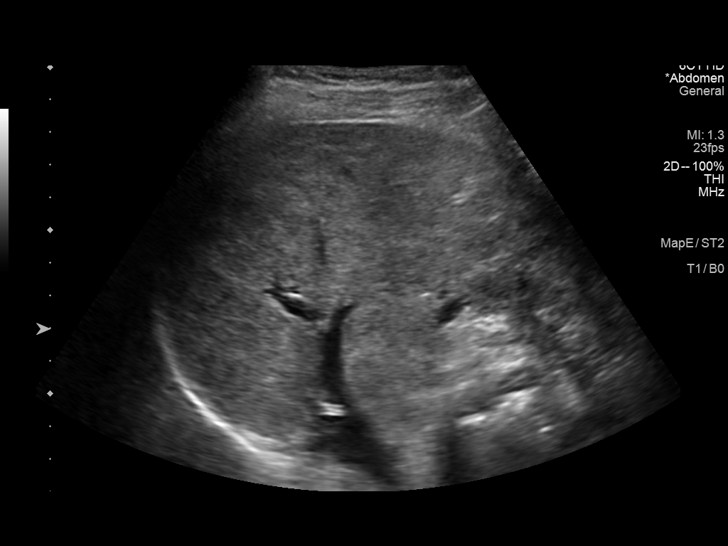
[im 20/30]
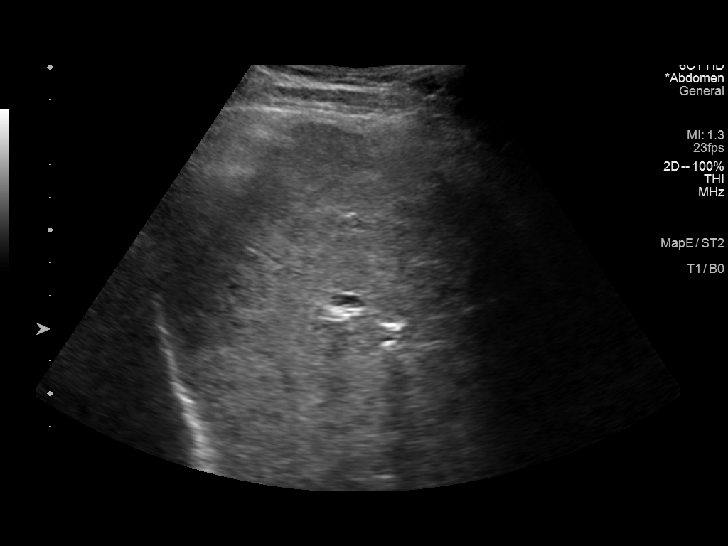
[im 22/30]
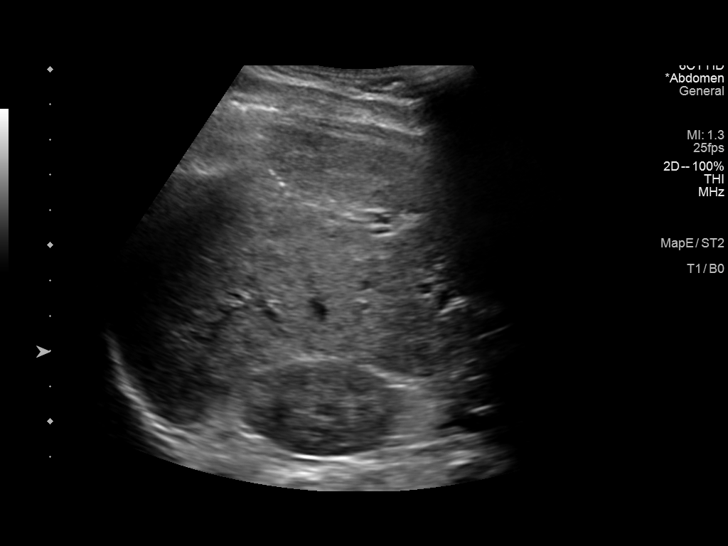
[im 25/30]
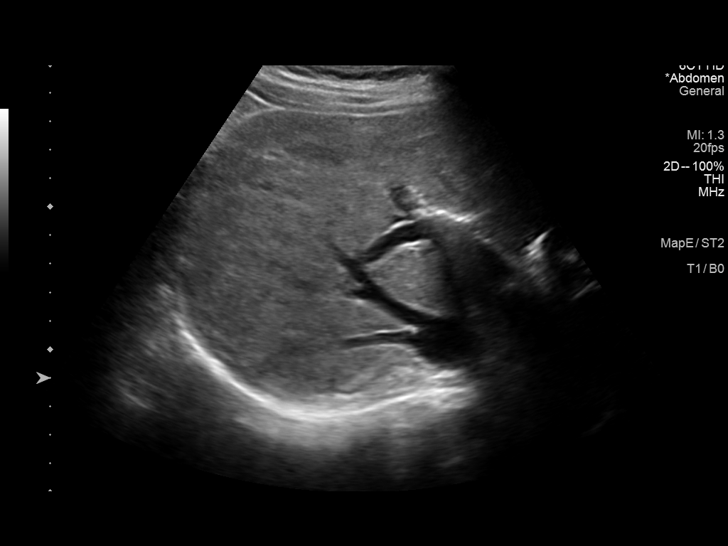
[im 27/30]
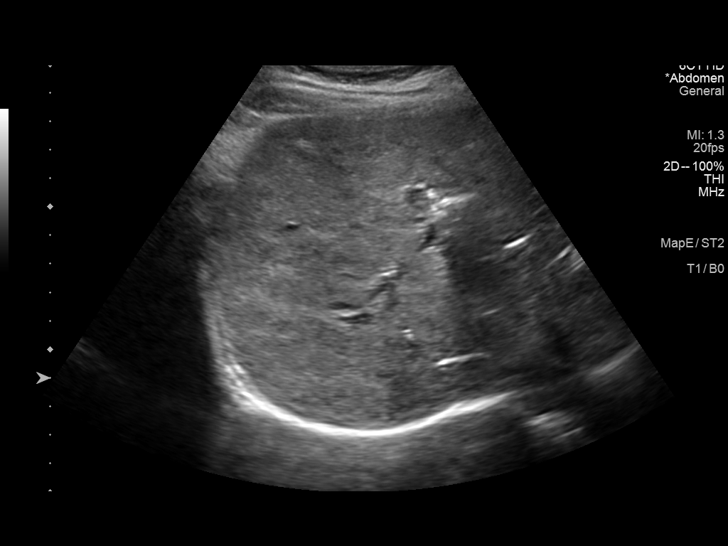
[im 30/30]
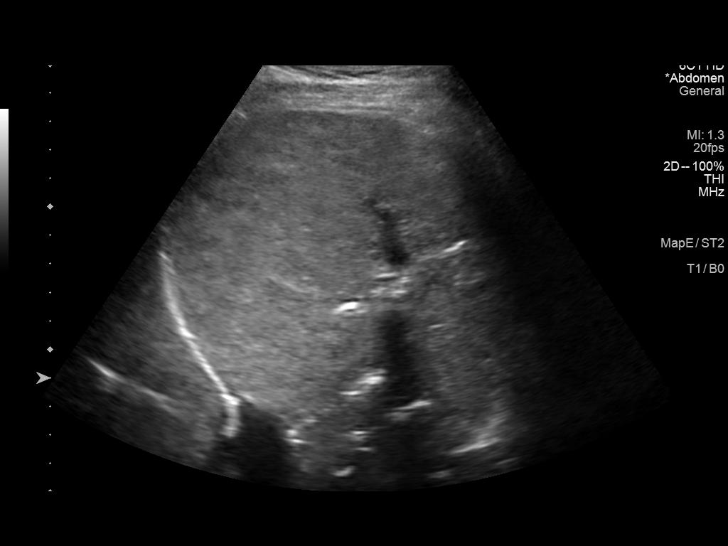

[14 of 25 positions shown; findings below may reference images not displayed]

FINDINGS: Gallbladder:

Surgically absent

Common bile duct:

Diameter: 5 mm, normal

Liver:

Heterogeneous parenchymal echogenicity. Few scattered hyperechoic
foci are seen within the liver, favor minimal pneumobilia from prior
ERCP. No discrete hepatic mass or nodularity. Portal vein is patent
on color Doppler imaging with normal direction of blood flow towards
the liver.

Other: No RIGHT upper quadrant free fluid.
IMPRESSION: Normal CBD diameter of 5 mm post cholecystectomy.

Heterogeneous parenchymal echogenicity of the liver, can be seen
with fatty infiltration and with parenchyma liver disease; recommend
correlation with LFTs.

If further hepatic evaluation is required consider either MR or CT.

## 2021-06-08 ENCOUNTER — Other Ambulatory Visit: Payer: Self-pay | Admitting: Internal Medicine

## 2021-06-08 DIAGNOSIS — K8020 Calculus of gallbladder without cholecystitis without obstruction: Secondary | ICD-10-CM

## 2021-06-08 DIAGNOSIS — K831 Obstruction of bile duct: Secondary | ICD-10-CM

## 2021-06-08 DIAGNOSIS — R748 Abnormal levels of other serum enzymes: Secondary | ICD-10-CM

## 2021-06-08 DIAGNOSIS — F17200 Nicotine dependence, unspecified, uncomplicated: Secondary | ICD-10-CM

## 2021-06-25 ENCOUNTER — Ambulatory Visit
Admission: RE | Admit: 2021-06-25 | Discharge: 2021-06-25 | Disposition: A | Discharge status: Home / Self Care | Payer: Self-pay | Source: Ambulatory Visit | Attending: Internal Medicine | Admitting: Internal Medicine

## 2021-06-25 DIAGNOSIS — F17200 Nicotine dependence, unspecified, uncomplicated: Secondary | ICD-10-CM

## 2021-07-01 ENCOUNTER — Other Ambulatory Visit: Payer: Self-pay | Admitting: Internal Medicine

## 2021-07-01 DIAGNOSIS — K8020 Calculus of gallbladder without cholecystitis without obstruction: Secondary | ICD-10-CM

## 2021-07-01 DIAGNOSIS — K831 Obstruction of bile duct: Secondary | ICD-10-CM

## 2021-07-01 DIAGNOSIS — R748 Abnormal levels of other serum enzymes: Secondary | ICD-10-CM

## 2021-07-10 NOTE — Progress Notes (Signed)
Date:  07/13/2021   ID:  ANGEE GUPTON, DOB Oct 22, 1961, MRN [id number]  PCP:  Jilda Panda, MD  Cardiologist:  Rex Kras, DO, [facility]  (established care 07/13/2021)  REASON FOR CONSULT: Abnormal finding on CT scan.   REQUESTING PHYSICIAN:  Jilda Panda, MD [id number] [place],   [id number]  Chief Complaint  Patient presents with   Coronary Artery Disease   New Patient (Initial Visit)    HPI  Kristina Knox is a 60 y.o. female who presents to the office with a chief complaint of " abnormal finding on CT scan." Patient's past medical history and cardiovascular risk factors include: Bipolar Depression, Smoking, coronary artery calcification (on non-gated CT scan 05/2021), Pulmonary nodule, aortic atherosclerosis, emphysema.  She is referred to the office at the request of Jilda Panda, MD for evaluation of abnormal finding on CT scan.  Patient recently underwent a lung cancer screening in July 2022 at the request of her PCP and was noted to have coronary artery as well as aortic atherosclerosis.  She is now referred to cardiology for further evaluation and management.  Independently reviewed the report from July 2022 summarized findings noted below.  Clinically she denies any chest pain or shortness of breath at rest or with effort related activities.  No structured exercise program or daily routine.  She continues to smoke at least 1 pack/day and has a greater than 30-year pack history of smoking.  FUNCTIONAL STATUS: No structured exercise program or daily routine.    ALLERGIES: Allergies  Allergen Reactions   Codeine Nausea And Vomiting    MEDICATION LIST PRIOR TO VISIT: Current Meds  Medication Sig   ARIPiprazole ER (ABILIFY MAINTENA) 400 MG PRSY prefilled syringe Take 437m im q 3 wks   Ascorbic Acid (VITAMIN C PO) Take 1 tablet by mouth daily.   aspirin EC 81 MG tablet Take 1 tablet (81 mg total) by mouth daily. Swallow whole.   benztropine (COGENTIN) 1 MG  tablet 1 tablet every 6 (six) hours as needed.   lithium carbonate (LITHOBID) 300 MG CR tablet 2 tablets at bedtime.    LORazepam (ATIVAN) 1 MG tablet 1 tablet 2 (two) times daily.     PAST MEDICAL HISTORY: Past Medical History:  Diagnosis Date   Bipolar disorder ([place]    Coronary atherosclerosis due to calcified coronary lesion    Kidney stones    Mood disorder ([place]    Seasonal allergies     PAST SURGICAL HISTORY: Past Surgical History:  Procedure Laterality Date   BILIARY STENT PLACEMENT N/A 12/08/2018   Procedure: BILIARY STENT PLACEMENT;  Surgeon: GGatha Mayer MD;  Location: [facility];  Service: Endoscopy;  Laterality: N/A;   ERCP N/A 12/08/2018   Procedure: ENDOSCOPIC RETROGRADE CHOLANGIOPANCREATOGRAPHY (ERCP);  Surgeon: GGatha Mayer MD;  [facility];  Service: Endoscopy;  Laterality: N/A;   SPHINCTEROTOMY  12/08/2018   Procedure: SPHINCTEROTOMY;  Surgeon: GGatha Mayer MD;  Location: [facility];  Service: Endoscopy;;  balloon sweep    FAMILY HISTORY: The patient family history includes Heart disease in her mother; Hypertension in her mother.  SOCIAL HISTORY:  The patient  reports that she has been smoking. She has never used smokeless tobacco. She reports that she does not drink alcohol and does not use drugs.  REVIEW OF SYSTEMS: Review of Systems  Constitutional: Negative for chills and fever.  HENT:  Negative for hoarse voice and nosebleeds.   Eyes:  Negative for discharge, double vision and  pain.  Cardiovascular:  Negative for chest pain, claudication, dyspnea on exertion, leg swelling, near-syncope, orthopnea, palpitations, paroxysmal nocturnal dyspnea and syncope.  Respiratory:  Negative for hemoptysis and shortness of breath.   Musculoskeletal:  Negative for muscle cramps and myalgias.  Gastrointestinal:  Negative for abdominal pain, constipation, diarrhea, hematemesis, hematochezia, melena, nausea and vomiting.  Neurological:  Negative  for dizziness and light-headedness.   PHYSICAL EXAM: Vitals with BMI 07/13/2021 01/11/2019 12/15/2018  Height '5\' 7"'  '5\' 7"'  -  Weight 133 lbs 117 lbs 2 oz -  BMI 10.30 13.14 -  Systolic 388 875 797  Diastolic 86 60 79  Pulse 89 88 97    CONSTITUTIONAL: Appears older than stated age, hemodynamically stable.  No acute distress.  SKIN: Skin is warm and dry. No rash noted. No cyanosis. No pallor. No jaundice HEAD: Normocephalic and atraumatic.  EYES: No scleral icterus MOUTH/THROAT: Moist oral membranes.  NECK: No JVD present. No thyromegaly noted. No carotid bruits  LYMPHATIC: No visible cervical adenopathy.  CHEST Normal respiratory effort. No intercostal retractions  LUNGS: Clear to auscultation bilaterally.  No stridor. No wheezes. No rales.  CARDIOVASCULAR: Regular, positive S1-S2, no murmurs rubs or gallops appreciated. ABDOMINAL: Soft, nontender, nondistended, positive bowel sounds in all 4 quadrants, no apparent ascites.  EXTREMITIES: No peripheral edema warm to touch bilaterally, 2+ DP pulses. HEMATOLOGIC: No significant bruising NEUROLOGIC: Oriented to person, place, and time. Nonfocal. Normal muscle tone.  PSYCHIATRIC: Normal mood and affect. Normal behavior. Cooperative  CARDIAC DATABASE: EKG: 07/13/2021: Normal sinus rhythm, 83 bpm, poor R wave progression, without underlying ischemia or injury pattern.  Echocardiogram: No results found for this or any previous visit from the past 1095 days.   Stress Testing: No results found for this or any previous visit from the past 1095 days.  Heart Catheterization: None  CT chest lung cancer screening: 05/2021: 1. Lung-RADS 2S, benign appearance or behavior. Continue annual screening with low-dose chest CT without contrast in 12 months. 2. The "S" modifier above refers to potentially clinically significant non lung cancer related findings. Specifically, there is aortic atherosclerosis, in addition to 2 vessel coronary artery  disease. Please note that although the presence of coronary artery calcium documents the presence of coronary artery disease, the severity of this disease and any potential stenosis cannot be assessed on this non-gated CT examination. Assessment for potential risk factor modification, dietary therapy or pharmacologic therapy may be warranted, if clinically indicated. 3. Mild diffuse bronchial wall thickening with very mild centrilobular and paraseptal emphysema; imaging findings suggestive of underlying COPD.   Aortic Atherosclerosis (ICD10-I70.0) and Emphysema (ICD10-J43.9).  LABORATORY DATA: CBC Latest Ref Rng & Units 12/09/2018 12/07/2018 12/06/2018  WBC 4.0 - 10.5 K/uL 7.1 7.1 8.0  Hemoglobin 12.0 - 15.0 g/dL 9.7(L) 9.5(L) 9.2(L)  Hematocrit 36.0 - 46.0 % 30.0(L) 28.4(L) 28.0(L)  Platelets 150 - 400 K/uL 413(H) 369 392    CMP Latest Ref Rng & Units 12/13/2018 12/12/2018 12/11/2018  Glucose 70 - 99 mg/dL 91 106(H) 116(H)  BUN 6 - 20 mg/dL 18 20 23(H)  Creatinine 0.44 - 1.00 mg/dL 0.53 0.54 0.57  Sodium 135 - 145 mmol/L 137 136 138  Potassium 3.5 - 5.1 mmol/L 4.3 4.3 3.9  Chloride 98 - 111 mmol/L 102 106 103  CO2 22 - 32 mmol/L 23 19(L) 22  Calcium 8.9 - 10.3 mg/dL 9.0 9.0 9.0  Total Protein 6.5 - 8.1 g/dL 6.9 7.3 7.4  Total Bilirubin 0.3 - 1.2 mg/dL 3.1(H) 3.5(H) 3.6(H)  Alkaline  Phos 38 - 126 U/L 756(H) 827(H) 924(H)  AST 15 - 41 U/L 61(H) 51(H) 69(H)  ALT 0 - 44 U/L 56(H) 59(H) 75(H)    Lipid Panel  No results found for: CHOL, TRIG, HDL, CHOLHDL, VLDL, LDLCALC, LDLDIRECT, LABVLDL  No components found for: NTPROBNP No results for input(s): PROBNP in the last 8760 hours. No results for input(s): TSH in the last 8760 hours.  BMP No results for input(s): NA, K, CL, CO2, GLUCOSE, BUN, CREATININE, CALCIUM, GFRNONAA, GFRAA in the last 8760 hours.  HEMOGLOBIN A1C No results found for: HGBA1C, MPG  IMPRESSION:    ICD-10-CM   1. Coronary atherosclerosis due to calcified coronary  lesion  I25.10 EKG 12-Lead   I25.84 Lipid Panel With LDL/HDL Ratio    LDL cholesterol, direct    CMP14+EGFR    CT CARDIAC SCORING ([facility] LOCATIONS ONLY)    PCV MYOCARDIAL PERFUSION WO LEXISCAN    PCV ECHOCARDIOGRAM COMPLETE    aspirin EC 81 MG tablet    2. Aortic atherosclerosis (HCC)  I70.0     3. Smoking  F17.200     4. Bipolar affective disorder  F31.9        RECOMMENDATIONS: WANEDA KLAMMER is a 60 y.o. female whose past medical history and cardiac risk factors include: Bipolar Depression, Smoking, coronary artery calcification (on non-gated CT scan 05/2021), aortic atherosclerosis, emphysema.  Coronary atherosclerosis due to calcified coronary lesion: Currently denies any angina pectoris. EKG nonischemic. Shared decision was to proceed with coronary calcium score to objectify the degree of coronary artery calcification compared to her peers. Echocardiogram will be ordered to evaluate for structural heart disease and left ventricular systolic function. Nuclear stress test recommended to evaluate for reversible ischemia. I do not have the most recent labs to review as a part of this encounter.  However, last office note provided by the PCP notes that she has a history of elevated alkaline phosphatase.  Because of that I do not want to start statin therapy without knowing her baseline lipid profile and liver function test.  Check fasting lipid profile. No prior history of bleeding.  I have asked her to start aspirin 81 mg p.o. daily.  Aortic atherosclerosis: See above  Smoking: Tobacco cessation counseling: Currently smoking 1 packs/day   Patient was informed of the dangers of tobacco abuse including stroke, cancer, and MI, as well as benefits of tobacco cessation. Patient is not willing to quit at this time. Approximately 5 mins were spent counseling patient cessation techniques. We discussed various methods to help quit smoking, including deciding on a date to quit, joining a  support group, pharmacological agents- nicotine gum/patch/lozenges, chantix.  I will reassess her progress at the next follow-up visit  Bipolar disorder: Currently on medical therapy.   FINAL MEDICATION LIST END OF ENCOUNTER: Meds ordered this encounter  Medications   aspirin EC 81 MG tablet    Sig: Take 1 tablet (81 mg total) by mouth daily. Swallow whole.    Dispense:  90 tablet    Refill:  3     Medications Discontinued During This Encounter  Medication Reason   Multiple Vitamins-Minerals (VISION FORMULA PO) Error   Omega-3 Fatty Acids (FISH OIL PO) Error   haloperidol (HALDOL) 10 MG tablet Error     Current Outpatient Medications:    ARIPiprazole ER (ABILIFY MAINTENA) 400 MG PRSY prefilled syringe, Take 444m im q 3 wks, Disp: , Rfl:    Ascorbic Acid (VITAMIN C PO), Take 1 tablet by mouth  daily., Disp: , Rfl:    aspirin EC 81 MG tablet, Take 1 tablet (81 mg total) by mouth daily. Swallow whole., Disp: 90 tablet, Rfl: 3   benztropine (COGENTIN) 1 MG tablet, 1 tablet every 6 (six) hours as needed., Disp: , Rfl:    lithium carbonate (LITHOBID) 300 MG CR tablet, 2 tablets at bedtime. , Disp: , Rfl:    LORazepam (ATIVAN) 1 MG tablet, 1 tablet 2 (two) times daily., Disp: , Rfl:   Orders Placed This Encounter  Procedures   CT CARDIAC SCORING ([facility] LOCATIONS ONLY)   Lipid Panel With LDL/HDL Ratio   LDL cholesterol, direct   CMP14+EGFR   PCV MYOCARDIAL PERFUSION WO LEXISCAN   EKG 12-Lead   PCV ECHOCARDIOGRAM COMPLETE    There are no Patient Instructions on file for this visit.   --Continue cardiac medications as reconciled in final medication list. --Return in about 4 weeks (around 08/10/2021) for Follow up, Coronary artery calcification, Review test results. Or sooner if needed. --Continue follow-up with your primary care physician regarding the management of your other chronic comorbid conditions.  Patient's questions and concerns were addressed to her satisfaction. She  voices understanding of the instructions provided during this encounter.   This note was created using a voice recognition software as a result there may be grammatical errors inadvertently enclosed that do not reflect the nature of this encounter. Every attempt is made to correct such errors.  Rex Kras, [place], [facility]  Pager: [phone] Office: [phone]

## 2021-07-13 ENCOUNTER — Other Ambulatory Visit: Payer: Self-pay

## 2021-07-13 ENCOUNTER — Ambulatory Visit: Payer: Medicare (Managed Care) | Admitting: Cardiology

## 2021-07-13 ENCOUNTER — Encounter: Payer: Self-pay | Admitting: Cardiology

## 2021-07-13 VITALS — BP 137/86 | HR 89 | Temp 98.8°F | Ht 67.0 in | Wt 133.0 lb

## 2021-07-13 DIAGNOSIS — F17200 Nicotine dependence, unspecified, uncomplicated: Secondary | ICD-10-CM

## 2021-07-13 DIAGNOSIS — I2510 Atherosclerotic heart disease of native coronary artery without angina pectoris: Secondary | ICD-10-CM

## 2021-07-13 DIAGNOSIS — F319 Bipolar disorder, unspecified: Secondary | ICD-10-CM

## 2021-07-13 DIAGNOSIS — I700 Atherosclerosis of aorta: Secondary | ICD-10-CM

## 2021-07-13 MED ORDER — ASPIRIN EC 81 MG PO TBEC: 81.0000 mg | DELAYED_RELEASE_TABLET | Freq: Every day | ORAL | 3 refills | Status: AC

## 2021-07-16 ENCOUNTER — Ambulatory Visit
Admission: RE | Admit: 2021-07-16 | Discharge: 2021-07-16 | Disposition: A | Discharge status: Home / Self Care | Payer: Medicare (Managed Care) | Source: Ambulatory Visit | Attending: Internal Medicine | Admitting: Internal Medicine

## 2021-07-16 ENCOUNTER — Other Ambulatory Visit: Payer: Self-pay

## 2021-07-16 DIAGNOSIS — R748 Abnormal levels of other serum enzymes: Secondary | ICD-10-CM

## 2021-07-16 DIAGNOSIS — K831 Obstruction of bile duct: Secondary | ICD-10-CM

## 2021-07-16 DIAGNOSIS — K8020 Calculus of gallbladder without cholecystitis without obstruction: Secondary | ICD-10-CM

## 2021-08-05 ENCOUNTER — Other Ambulatory Visit: Payer: Self-pay | Admitting: Internal Medicine

## 2021-08-05 DIAGNOSIS — R935 Abnormal findings on diagnostic imaging of other abdominal regions, including retroperitoneum: Secondary | ICD-10-CM

## 2021-08-05 DIAGNOSIS — R109 Unspecified abdominal pain: Secondary | ICD-10-CM

## 2021-08-24 ENCOUNTER — Ambulatory Visit
Admission: RE | Admit: 2021-08-24 | Discharge: 2021-08-24 | Disposition: A | Discharge status: Home / Self Care | Payer: No Typology Code available for payment source | Source: Ambulatory Visit | Attending: Cardiology | Admitting: Cardiology

## 2021-08-24 ENCOUNTER — Ambulatory Visit
Admission: RE | Admit: 2021-08-24 | Discharge: 2021-08-24 | Disposition: A | Discharge status: Home / Self Care | Payer: Medicare (Managed Care) | Source: Ambulatory Visit | Attending: Internal Medicine | Admitting: Internal Medicine

## 2021-08-24 DIAGNOSIS — R935 Abnormal findings on diagnostic imaging of other abdominal regions, including retroperitoneum: Secondary | ICD-10-CM

## 2021-08-24 DIAGNOSIS — I2510 Atherosclerotic heart disease of native coronary artery without angina pectoris: Secondary | ICD-10-CM

## 2021-08-24 MED ORDER — IOPAMIDOL (ISOVUE-300) INJECTION 61%
100.0000 mL | Freq: Once | INTRAVENOUS | Status: AC | PRN
  Administered 2021-08-24: 100 mL via INTRAVENOUS

## 2021-08-25 ENCOUNTER — Inpatient Hospital Stay: Admission: RE | Admit: 2021-08-25 | Payer: Medicare (Managed Care) | Source: Ambulatory Visit

## 2021-08-27 NOTE — Progress Notes (Signed)
I called patient's guardian again to tell him what her results mean and he voiced understanding I told him patient hasn't scheduled her echo or stress he mention again patient was moved into assisted living so he needs to figure something out once he does he will let us know

## 2021-08-27 NOTE — Progress Notes (Signed)
Called patient's guardian who is her brother Kristina Knox and he has questions regarding the score 36 what did it mean if you could explain it so I can let him know and I will fax over her results to her PCP. Regarding the test patient was moved into a assisted living.

## 2022-04-13 ENCOUNTER — Telehealth: Payer: Self-pay | Admitting: Cardiology

## 2022-04-13 NOTE — Telephone Encounter (Signed)
Called pt to schedule her for her 1 year echo, her brother let me know that she will no longer be following our care as she has moved from the area and is in the process of being transferred to assisted living.  ?
